# Patient Record
Sex: Female | Born: 1990 | Race: White | Hispanic: No | Marital: Married | Smoking: Never smoker
Health system: Southern US, Community
[De-identification: ages and names within clinical notes are randomized; demographics above are authoritative.]

## PROBLEM LIST (undated history)

## (undated) DIAGNOSIS — K589 Irritable bowel syndrome without diarrhea: Secondary | ICD-10-CM

## (undated) HISTORY — PX: IUD REMOVAL: SHX5392

## (undated) HISTORY — DX: Irritable bowel syndrome, unspecified: K58.9

---

## 2012-09-14 ENCOUNTER — Ambulatory Visit (INDEPENDENT_AMBULATORY_CARE_PROVIDER_SITE_OTHER): Payer: BC Managed Care – PPO

## 2012-09-14 ENCOUNTER — Other Ambulatory Visit: Payer: Self-pay | Admitting: Family Medicine

## 2012-09-14 DIAGNOSIS — Z561 Change of job: Secondary | ICD-10-CM

## 2012-09-14 DIAGNOSIS — Z0289 Encounter for other administrative examinations: Secondary | ICD-10-CM

## 2012-09-24 ENCOUNTER — Encounter: Payer: Self-pay | Admitting: Certified Nurse Midwife

## 2012-09-27 ENCOUNTER — Ambulatory Visit: Payer: Self-pay | Admitting: Certified Nurse Midwife

## 2012-11-06 ENCOUNTER — Telehealth: Payer: Self-pay | Admitting: Certified Nurse Midwife

## 2012-11-06 NOTE — Telephone Encounter (Signed)
Spoke with pt who would like IUD removed because she and her husband are going to [place] when it is scheduled to be removed at the 5 year mark. Pt would rather have it removed before she goes. Scheduled appt 11-13-12 at 2 pm with DL. Advised someone from insurance dept would be calling with OOP cost. Pt agreeable.

## 2012-11-06 NOTE — Telephone Encounter (Signed)
Patient wants to schedule an appointment for iud removal and does not wants another one inserted. Please advise? AEX 11/19/12.

## 2012-11-12 ENCOUNTER — Other Ambulatory Visit: Payer: Self-pay | Admitting: Certified Nurse Midwife

## 2012-11-12 DIAGNOSIS — Z309 Encounter for contraceptive management, unspecified: Secondary | ICD-10-CM

## 2012-11-12 NOTE — Progress Notes (Signed)
Pt is having IUD removal on 11/12/12.  Order entered to be linked to appt.

## 2012-11-13 ENCOUNTER — Encounter: Payer: Self-pay | Admitting: Certified Nurse Midwife

## 2012-11-13 ENCOUNTER — Ambulatory Visit (INDEPENDENT_AMBULATORY_CARE_PROVIDER_SITE_OTHER): Payer: BC Managed Care – PPO | Admitting: Certified Nurse Midwife

## 2012-11-13 VITALS — BP 100/64 | HR 64 | Resp 16 | Ht 62.75 in | Wt 164.0 lb

## 2012-11-13 DIAGNOSIS — Z30432 Encounter for removal of intrauterine contraceptive device: Secondary | ICD-10-CM

## 2012-11-13 NOTE — Progress Notes (Signed)
[name]      Presents for [facility] IUD removal.  Denies any vaginal symptoms or STD concerns.  Plans for contraception arecondoms or natural family planning. 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  Will come in for aex before leaving. No health issues today.  LMPJuly 25.2014        HPI neg.  Exam: time, place and person Abdomen: soft non-tender Groin:no inguinal nodes palpated    Pelvic exam:Pelvic exam: normal external genitalia, vulva, vagina, cervix, uterus and adnexa, VULVA: normal appearing vulva with no masses, tenderness or lesions, VAGINA: normal appearing vagina with normal color and discharge, no lesions, CERVIX: normal appearing cervix without discharge or lesions, IUD noted in cervix, UTERUS: uterus is normal size, shape, consistency and nontender, anteverted, ADNEXA: normal adnexa in size, nontender and no masses.  Procedure: Speculum placed, cervix visualized.  IUD string visualized, grasp with ring forceps, with gentle traction IUD removed intact.  IUD shown to patient and discarded. Speculum removed.   Assessment:Skyla IUD removal Pt tolerated procedure well.  Plan: Begin contraceptive choice ofcondoms and natural family planning   Questions addressed  Return Visit: Aex

## 2012-11-15 NOTE — Progress Notes (Signed)
Note reviewed, agree with plan.  Mekhi Sonn, MD  

## 2012-11-19 ENCOUNTER — Ambulatory Visit (INDEPENDENT_AMBULATORY_CARE_PROVIDER_SITE_OTHER): Payer: BC Managed Care – PPO | Admitting: Certified Nurse Midwife

## 2012-11-19 ENCOUNTER — Encounter: Payer: Self-pay | Admitting: Certified Nurse Midwife

## 2012-11-19 VITALS — BP 100/60 | HR 60 | Resp 16 | Ht 62.75 in | Wt 162.0 lb

## 2012-11-19 DIAGNOSIS — Z01419 Encounter for gynecological examination (general) (routine) without abnormal findings: Secondary | ICD-10-CM

## 2012-11-19 DIAGNOSIS — Z0000 Encounter for general adult medical examination without abnormal findings: Secondary | ICD-10-CM

## 2012-11-19 NOTE — Patient Instructions (Addendum)
General topics  Next pap or exam is  due in 1 year Take a Women's multivitamin Take 1200 mg. of calcium daily - prefer dietary If any concerns in interim to call back  Breast Self-Awareness Practicing breast self-awareness may pick up problems early, prevent significant medical complications, and possibly save your life. By practicing breast self-awareness, you can become familiar with how your breasts look and feel and if your breasts are changing. This allows you to notice changes early. It can also offer you some reassurance that your breast health is good. One way to learn what is normal for your breasts and whether your breasts are changing is to do a breast self-exam. If you find a lump or something that was not present in the past, it is best to contact your caregiver right away. Other findings that should be evaluated by your caregiver include nipple discharge, especially if it is bloody; skin changes or reddening; areas where the skin seems to be pulled in (retracted); or new lumps and bumps. Breast pain is seldom associated with cancer (malignancy), but should also be evaluated by a caregiver. BREAST SELF-EXAM The best time to examine your breasts is 5 7 days after your menstrual period is over.  ExitCare Patient Information 2013 [employer].   Exercise to Stay Healthy Exercise helps you become and stay healthy. EXERCISE IDEAS AND TIPS Choose exercises that:  You enjoy.  Fit into your day. You do not need to exercise really hard to be healthy. You can do exercises at a slow or medium level and stay healthy. You can:  Stretch before and after working out.  Try yoga, Pilates, or tai chi.  Lift weights.  Walk fast, swim, jog, run, climb stairs, bicycle, dance, or rollerskate.  Take aerobic classes. Exercises that burn about 150 calories:  Running 1  miles in 15 minutes.  Playing volleyball for 45 to 60 minutes.  Washing and waxing a car for 45 to 60  minutes.  Playing touch football for 45 minutes.  Walking 1  miles in 35 minutes.  Pushing a stroller 1  miles in 30 minutes.  Playing basketball for 30 minutes.  Raking leaves for 30 minutes.  Bicycling 5 miles in 30 minutes.  Walking 2 miles in 30 minutes.  Dancing for 30 minutes.  Shoveling snow for 15 minutes.  Swimming laps for 20 minutes.  Walking up stairs for 15 minutes.  Bicycling 4 miles in 15 minutes.  Gardening for 30 to 45 minutes.  Jumping rope for 15 minutes.  Washing windows or floors for 45 to 60 minutes. Document Released: 05/07/2010 Document Revised: 06/27/2011 Document Reviewed: 05/07/2010 ExitCare Patient Information 2013 [employer].   Other topics ( that may be useful information):    Sexually Transmitted Disease Sexually transmitted disease (STD) refers to any infection that is passed from person to person during sexual activity. This may happen by way of saliva, semen, blood, vaginal mucus, or urine. Common STDs include:  Gonorrhea.  Chlamydia.  Syphilis.  HIV/AIDS.  Genital herpes.  Hepatitis B and C.  Trichomonas.  Human papillomavirus (HPV).  Pubic lice. CAUSES  An STD may be spread by bacteria, virus, or parasite. A person can get an STD by:  Sexual intercourse with an infected person.  Sharing sex toys with an infected person.  Sharing needles with an infected person.  Having intimate contact with the genitals, mouth, or rectal areas of an infected person. SYMPTOMS  Some people may not have any symptoms, but   they can still pass the infection to others. Different STDs have different symptoms. Symptoms include:  Painful or bloody urination.  Pain in the pelvis, abdomen, vagina, anus, throat, or eyes.  Skin rash, itching, irritation, growths, or sores (lesions). These usually occur in the genital or anal area.  Abnormal vaginal discharge.  Penile discharge in men.  Soft, flesh-colored skin growths in the  genital or anal area.  Fever.  Pain or bleeding during sexual intercourse.  Swollen glands in the groin area.  Yellow skin and eyes (jaundice). This is seen with hepatitis. DIAGNOSIS  To make a diagnosis, your caregiver may:  Take a medical history.  Perform a physical exam.  Take a specimen (culture) to be examined.  Examine a sample of discharge under a microscope.  Perform blood test TREATMENT   Chlamydia, gonorrhea, trichomonas, and syphilis can be cured with antibiotic medicine.  Genital herpes, hepatitis, and HIV can be treated, but not cured, with prescribed medicines. The medicines will lessen the symptoms.  Genital warts from HPV can be treated with medicine or by freezing, burning (electrocautery), or surgery. Warts may come back.  HPV is a virus and cannot be cured with medicine or surgery.However, abnormal areas may be followed very closely by your caregiver and may be removed from the cervix, vagina, or vulva through office procedures or surgery. If your diagnosis is confirmed, your recent sexual partners need treatment. This is true even if they are symptom-free or have a negative culture or evaluation. They should not have sex until their caregiver says it is okay. HOME CARE INSTRUCTIONS  All sexual partners should be informed, tested, and treated for all STDs.  Take your antibiotics as directed. Finish them even if you start to feel better.  Only take over-the-counter or prescription medicines for pain, discomfort, or fever as directed by your caregiver.  Rest.  Eat a balanced diet and drink enough fluids to keep your urine clear or pale yellow.  Do not have sex until treatment is completed and you have followed up with your caregiver. STDs should be checked after treatment.  Keep all follow-up appointments, Pap tests, and blood tests as directed by your caregiver.  Only use latex condoms and water-soluble lubricants during sexual activity. Do not use  petroleum jelly or oils.  Avoid alcohol and illegal drugs.  Get vaccinated for HPV and hepatitis. If you have not received these vaccines in the past, talk to your caregiver about whether one or both might be right for you.  Avoid risky sex practices that can break the skin. The only way to avoid getting an STD is to avoid all sexual activity.Latex condoms and dental dams (for oral sex) will help lessen the risk of getting an STD, but will not completely eliminate the risk. SEEK MEDICAL CARE IF:   You have a fever.  You have any new or worsening symptoms. Document Released: 06/25/2002 Document Revised: 06/27/2011 Document Reviewed: 07/02/2010 ExitCare Patient Information 2013 [employer].    Domestic Abuse You are being battered or abused if someone close to you hits, pushes, or physically hurts you in any way. You also are being abused if you are forced into activities. You are being sexually abused if you are forced to have sexual contact of any kind. You are being emotionally abused if you are made to feel worthless or if you are constantly threatened. It is important to remember that help is available. No one has the right to abuse you. PREVENTION OF FURTHER   ABUSE  Learn the warning signs of danger. This varies with situations but may include: the use of alcohol, threats, isolation from friends and family, or forced sexual contact. Leave if you feel that violence is going to occur.  If you are attacked or beaten, report it to the police so the abuse is documented. You do not have to press charges. The police can protect you while you or the attackers are leaving. Get the officer's name and badge number and a copy of the report.  Find someone you can trust and tell them what is happening to you: your caregiver, a nurse, clergy member, close friend or family member. Feeling ashamed is natural, but remember that you have done nothing wrong. No one deserves abuse. Document Released:  04/01/2000 Document Revised: 06/27/2011 Document Reviewed: 06/10/2010 ExitCare Patient Information 2013 [employer].    How Much is Too Much Alcohol? Drinking too much alcohol can cause injury, accidents, and health problems. These types of problems can include:   Car crashes.  Falls.  Family fighting (domestic violence).  Drowning.  Fights.  Injuries.  Burns.  Damage to certain organs.  Having a baby with birth defects. ONE DRINK CAN BE TOO MUCH WHEN YOU ARE:  Working.  Pregnant or breastfeeding.  Taking medicines. Ask your doctor.  Driving or planning to drive. If you or someone you know has a drinking problem, get help from a doctor.  Document Released: 01/29/2009 Document Revised: 06/27/2011 Document Reviewed: 01/29/2009 ExitCare Patient Information 2013 [employer].   Smoking Hazards Smoking cigarettes is extremely bad for your health. Tobacco smoke has over 200 known poisons in it. There are over 60 chemicals in tobacco smoke that cause cancer. Some of the chemicals found in cigarette smoke include:   Cyanide.  Benzene.  Formaldehyde.  Methanol (wood alcohol).  Acetylene (fuel used in welding torches).  Ammonia. Cigarette smoke also contains the poisonous gases nitrogen oxide and carbon monoxide.  Cigarette smokers have an increased risk of many serious medical problems and Smoking causes approximately:  90% of all lung cancer deaths in men.  80% of all lung cancer deaths in women.  90% of deaths from chronic obstructive lung disease. Compared with nonsmokers, smoking increases the risk of:  Coronary heart disease by 2 to 4 times.  Stroke by 2 to 4 times.  Men developing lung cancer by 23 times.  Women developing lung cancer by 13 times.  Dying from chronic obstructive lung diseases by 12 times.  . Smoking is the most preventable cause of death and disease in our society.  WHY IS SMOKING ADDICTIVE?  Nicotine is the chemical  agent in tobacco that is capable of causing addiction or dependence.  When you smoke and inhale, nicotine is absorbed rapidly into the bloodstream through your lungs. Nicotine absorbed through the lungs is capable of creating a powerful addiction. Both inhaled and non-inhaled nicotine may be addictive.  Addiction studies of cigarettes and spit tobacco show that addiction to nicotine occurs mainly during the teen years, when young people begin using tobacco products. WHAT ARE THE BENEFITS OF QUITTING?  There are many health benefits to quitting smoking.   Likelihood of developing cancer and heart disease decreases. Health improvements are seen almost immediately.  Blood pressure, pulse rate, and breathing patterns start returning to normal soon after quitting. QUITTING SMOKING   American Lung Association - 1-800-LUNGUSA  American Cancer Society - 1-800-ACS-2345 Document Released: 05/12/2004 Document Revised: 06/27/2011 Document Reviewed: 01/14/2009 ExitCare Patient Information 2013 ExitCare[employer]   LL[employer].   Stress Management Stress is a state of physical or mental tension that often results from changes in your life or normal routine. Some common causes of stress are:  Death of a loved one.  Injuries or severe illnesses.  Getting fired or changing jobs.  Moving into a new home. Other causes may be:  Sexual problems.  Business or financial losses.  Taking on a large debt.  Regular conflict with someone at home or at work.  Constant tiredness from lack of sleep. It is not just bad things that are stressful. It may be stressful to:  Win the lottery.  Get married.  Buy a new car. The amount of stress that can be easily tolerated varies from person to person. Changes generally cause stress, regardless of the types of change. Too much stress can affect your health. It may lead to physical or emotional problems. Too little stress (boredom) may also become stressful. SUGGESTIONS TO  REDUCE STRESS:  Talk things over with your family and friends. It often is helpful to share your concerns and worries. If you feel your problem is serious, you may want to get help from a professional counselor.  Consider your problems one at a time instead of lumping them all together. Trying to take care of everything at once may seem impossible. List all the things you need to do and then start with the most important one. Set a goal to accomplish 2 or 3 things each day. If you expect to do too many in a single day you will naturally fail, causing you to feel even more stressed.  Do not use alcohol or drugs to relieve stress. Although you may feel better for a short time, they do not remove the problems that caused the stress. They can also be habit forming.  Exercise regularly - at least 3 times per week. Physical exercise can help to relieve that "uptight" feeling and will relax you.  The shortest distance between despair and hope is often a good night's sleep.  Go to bed and get up on time allowing yourself time for appointments without being rushed.  Take a short "time-out" period from any stressful situation that occurs during the day. Close your eyes and take some deep breaths. Starting with the muscles in your face, tense them, hold it for a few seconds, then relax. Repeat this with the muscles in your neck, shoulders, hand, stomach, back and legs.  Take good care of yourself. Eat a balanced diet and get plenty of rest.  Schedule time for having fun. Take a break from your daily routine to relax. HOME CARE INSTRUCTIONS   Call if you feel overwhelmed by your problems and feel you can no longer manage them on your own.  Return immediately if you feel like hurting yourself or someone else. Document Released: 09/28/2000 Document Revised: 06/27/2011 Document Reviewed: 05/21/2007 ExitCare Patient Information 2013 [employer].   

## 2012-11-19 NOTE — Progress Notes (Signed)
22 y.o. G0P0000 Married Caucasian Fe here for annual exam.  Period normal after IUD removal a week ago. Denies any problems today. 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  Contraception is natural family planning with condom use.  Patient's last menstrual period was 11/09/2012.          Sexually active: yes  The current method of family planning is rhythm method.    Exercising: yes  aerobics, pilates Smoker:  no  Health Maintenance: Pap:  08-29-11 neg MMG:  none Colonoscopy:  none BMD:   none TDaP:  2008 Labs: none Self breast exam done: done monthly   reports that she has never smoked. She does not have any smokeless tobacco history on file. She reports that she does not drink alcohol or use illicit drugs.  Past Medical History  Diagnosis Date  . IBS (irritable bowel syndrome)     ?    Past Surgical History  Procedure Laterality Date  . Iud removal      skyla removed 7/14    Current Outpatient Prescriptions  Medication Sig Dispense Refill  . Multiple Vitamins-Minerals (MULTIVITAMIN PO) Take by mouth daily.       No current facility-administered medications for this visit.    Family History  Problem Relation Age of Onset  . Multiple sclerosis Mother   . Thyroid disease Mother   . Osteoporosis Mother   . Depression Father   . Anxiety disorder Father   . Cancer Paternal Grandfather     colon  . Heart attack Maternal Grandmother   . Heart attack Maternal Grandfather     ROS:  Pertinent items are noted in HPI.  Otherwise, a comprehensive ROS was negative.  Exam:   BP 100/60  Pulse 60  Resp 16  Ht 5' 2.75" (1.594 m)  Wt 162 lb (73.483 kg)  BMI 28.92 kg/m2  LMP 11/09/2012 Height: 5' 2.75" (159.4 cm)  Ht Readings from Last 3 Encounters:  11/19/12 5' 2.75" (1.594 m)  11/13/12 5' 2.75" (1.594 m)    General appearance: alert, cooperative and appears stated age Head: Normocephalic, without obvious abnormality, atraumatic Neck: no  adenopathy, supple, symmetrical, trachea midline and thyroid normal to inspection and palpation Lungs: clear to auscultation bilaterally Breasts: normal appearance, no masses or tenderness, No nipple retraction or dimpling, No nipple discharge or bleeding, No axillary or supraclavicular adenopathy Heart: regular rate and rhythm Abdomen: soft, non-tender; no masses,  no organomegaly Extremities: extremities normal, atraumatic, no cyanosis or edema Skin: Skin color, texture, turgor normal. No rashes or lesions Lymph nodes: Cervical, supraclavicular, and axillary nodes normal. No abnormal inguinal nodes palpated Neurologic: Grossly normal   Pelvic: External genitalia:  no lesions              Urethra:  normal appearing urethra with no masses, tenderness or lesions              Bartholin's and Skene's: normal                 Vagina: normal appearing vagina with normal color and discharge, no lesions              Cervix: normal, non tender              Pap taken: yes Bimanual Exam:  Uterus:  normal size, contour, position, consistency, mobility, non-tender and anteverted              Adnexa: normal adnexa and no mass, fullness, tenderness  Rectovaginal: Confirms               Anus:  normal sphincter tone, no lesions  A:  Well Woman with normal exam  Contraception, NFP with condom use  Moving to [place] for 2 years  P:   Reviewed health and wellness pertinent to exam  Has ovulation chart and BBT graph to help monitor safe days.  Pap smear as per guidelines   pap smear taken today  counseled on breast self exam, feminine hygiene, adequate intake of calcium and vitamin D, diet and exercise  return annually or prn  An After Visit Summary was printed and given to the patient.

## 2012-11-19 NOTE — Progress Notes (Signed)
Note reviewed, agree with plan.  Melyna Huron, MD  

## 2013-12-05 ENCOUNTER — Encounter: Payer: Self-pay | Admitting: Certified Nurse Midwife

## 2013-12-05 ENCOUNTER — Ambulatory Visit (INDEPENDENT_AMBULATORY_CARE_PROVIDER_SITE_OTHER): Payer: BC Managed Care – PPO | Admitting: Certified Nurse Midwife

## 2013-12-05 VITALS — BP 90/60 | HR 64 | Resp 16 | Ht 62.75 in | Wt 172.0 lb

## 2013-12-05 DIAGNOSIS — Z01419 Encounter for gynecological examination (general) (routine) without abnormal findings: Secondary | ICD-10-CM

## 2013-12-05 DIAGNOSIS — Z0000 Encounter for general adult medical examination without abnormal findings: Secondary | ICD-10-CM

## 2013-12-05 LAB — POCT URINALYSIS DIPSTICK
Bilirubin, UA: NEGATIVE
Blood, UA: NEGATIVE
Glucose, UA: NEGATIVE
KETONES UA: NEGATIVE
LEUKOCYTES UA: NEGATIVE
Nitrite, UA: NEGATIVE
PH UA: 5
PROTEIN UA: NEGATIVE
UROBILINOGEN UA: NEGATIVE

## 2013-12-05 LAB — HEMOGLOBIN, FINGERSTICK: Hemoglobin, fingerstick: 15.3 g/dL (ref 12.0–16.0)

## 2013-12-05 NOTE — Patient Instructions (Signed)
General topics  Next pap or exam is  due in 1 year Take a Women's multivitamin Take 1200 mg. of calcium daily - prefer dietary If any concerns in interim to call back  Breast Self-Awareness Practicing breast self-awareness may pick up problems early, prevent significant medical complications, and possibly save your life. By practicing breast self-awareness, you can become familiar with how your breasts look and feel and if your breasts are changing. This allows you to notice changes early. It can also offer you some reassurance that your breast health is good. One way to learn what is normal for your breasts and whether your breasts are changing is to do a breast self-exam. If you find a lump or something that was not present in the past, it is best to contact your caregiver right away. Other findings that should be evaluated by your caregiver include nipple discharge, especially if it is bloody; skin changes or reddening; areas where the skin seems to be pulled in (retracted); or new lumps and bumps. Breast pain is seldom associated with cancer (malignancy), but should also be evaluated by a caregiver. BREAST SELF-EXAM The best time to examine your breasts is 5 7 days after your menstrual period is over.  ExitCare Patient Information 2013 [employer].   Exercise to Stay Healthy Exercise helps you become and stay healthy. EXERCISE IDEAS AND TIPS Choose exercises that:  You enjoy.  Fit into your day. You do not need to exercise really hard to be healthy. You can do exercises at a slow or medium level and stay healthy. You can:  Stretch before and after working out.  Try yoga, Pilates, or tai chi.  Lift weights.  Walk fast, swim, jog, run, climb stairs, bicycle, dance, or rollerskate.  Take aerobic classes. Exercises that burn about 150 calories:  Running 1  miles in 15 minutes.  Playing volleyball for 45 to 60 minutes.  Washing and waxing a car for 45 to 60  minutes.  Playing touch football for 45 minutes.  Walking 1  miles in 35 minutes.  Pushing a stroller 1  miles in 30 minutes.  Playing basketball for 30 minutes.  Raking leaves for 30 minutes.  Bicycling 5 miles in 30 minutes.  Walking 2 miles in 30 minutes.  Dancing for 30 minutes.  Shoveling snow for 15 minutes.  Swimming laps for 20 minutes.  Walking up stairs for 15 minutes.  Bicycling 4 miles in 15 minutes.  Gardening for 30 to 45 minutes.  Jumping rope for 15 minutes.  Washing windows or floors for 45 to 60 minutes. Document Released: 05/07/2010 Document Revised: 06/27/2011 Document Reviewed: 05/07/2010 ExitCare Patient Information 2013 Exit[employer].   Other topics ( that may be useful information):    Sexually Transmitted Disease Sexually transmitted disease (STD) refers to any infection that is passed from person to person during sexual activity. This may happen by way of saliva, semen, blood, vaginal mucus, or urine. Common STDs include:  Gonorrhea.  Chlamydia.  Syphilis.  HIV/AIDS.  Genital herpes.  Hepatitis B and C.  Trichomonas.  Human papillomavirus (HPV).  Pubic lice. CAUSES  An STD may be spread by bacteria, virus, or parasite. A person can get an STD by:  Sexual intercourse with an infected person.  Sharing sex toys with an infected person.  Sharing needles with an infected person.  Having intimate contact with the genitals, mouth, or rectal areas of an infected person. SYMPTOMS  Some people may not have any symptoms, but   they can still pass the infection to others. Different STDs have different symptoms. Symptoms include:  Painful or bloody urination.  Pain in the pelvis, abdomen, vagina, anus, throat, or eyes.  Skin rash, itching, irritation, growths, or sores (lesions). These usually occur in the genital or anal area.  Abnormal vaginal discharge.  Penile discharge in men.  Soft, flesh-colored skin growths in the  genital or anal area.  Fever.  Pain or bleeding during sexual intercourse.  Swollen glands in the groin area.  Yellow skin and eyes (jaundice). This is seen with hepatitis. DIAGNOSIS  To make a diagnosis, your caregiver may:  Take a medical history.  Perform a physical exam.  Take a specimen (culture) to be examined.  Examine a sample of discharge under a microscope.  Perform blood test TREATMENT   Chlamydia, gonorrhea, trichomonas, and syphilis can be cured with antibiotic medicine.  Genital herpes, hepatitis, and HIV can be treated, but not cured, with prescribed medicines. The medicines will lessen the symptoms.  Genital warts from HPV can be treated with medicine or by freezing, burning (electrocautery), or surgery. Warts may come back.  HPV is a virus and cannot be cured with medicine or surgery.However, abnormal areas may be followed very closely by your caregiver and may be removed from the cervix, vagina, or vulva through office procedures or surgery. If your diagnosis is confirmed, your recent sexual partners need treatment. This is true even if they are symptom-free or have a negative culture or evaluation. They should not have sex until their caregiver says it is okay. HOME CARE INSTRUCTIONS  All sexual partners should be informed, tested, and treated for all STDs.  Take your antibiotics as directed. Finish them even if you start to feel better.  Only take over-the-counter or prescription medicines for pain, discomfort, or fever as directed by your caregiver.  Rest.  Eat a balanced diet and drink enough fluids to keep your urine clear or pale yellow.  Do not have sex until treatment is completed and you have followed up with your caregiver. STDs should be checked after treatment.  Keep all follow-up appointments, Pap tests, and blood tests as directed by your caregiver.  Only use latex condoms and water-soluble lubricants during sexual activity. Do not use  petroleum jelly or oils.  Avoid alcohol and illegal drugs.  Get vaccinated for HPV and hepatitis. If you have not received these vaccines in the past, talk to your caregiver about whether one or both might be right for you.  Avoid risky sex practices that can break the skin. The only way to avoid getting an STD is to avoid all sexual activity.Latex condoms and dental dams (for oral sex) will help lessen the risk of getting an STD, but will not completely eliminate the risk. SEEK MEDICAL CARE IF:   You have a fever.  You have any new or worsening symptoms. Document Released: 06/25/2002 Document Revised: 06/27/2011 Document Reviewed: 07/02/2010 [facility] Patient Information 2013 [employer].    Domestic Abuse You are being battered or abused if someone close to you hits, pushes, or physically hurts you in any way. You also are being abused if you are forced into activities. You are being sexually abused if you are forced to have sexual contact of any kind. You are being emotionally abused if you are made to feel worthless or if you are constantly threatened. It is important to remember that help is available. No one has the right to abuse you. PREVENTION OF FURTHER  ABUSE  Learn the warning signs of danger. This varies with situations but may include: the use of alcohol, threats, isolation from friends and family, or forced sexual contact. Leave if you feel that violence is going to occur.  If you are attacked or beaten, report it to the police so the abuse is documented. You do not have to press charges. The police can protect you while you or the attackers are leaving. Get the officer's name and badge number and a copy of the report.  Find someone you can trust and tell them what is happening to you: your caregiver, a nurse, clergy member, close friend or family member. Feeling ashamed is natural, but remember that you have done nothing wrong. No one deserves abuse. Document Released:  04/01/2000 Document Revised: 06/27/2011 Document Reviewed: 06/10/2010 ExitCare Patient Information 2013 [employer].    How Much is Too Much Alcohol? Drinking too much alcohol can cause injury, accidents, and health problems. These types of problems can include:   Car crashes.  Falls.  Family fighting (domestic violence).  Drowning.  Fights.  Injuries.  Burns.  Damage to certain organs.  Having a baby with birth defects. ONE DRINK CAN BE TOO MUCH WHEN YOU ARE:  Working.  Pregnant or breastfeeding.  Taking medicines. Ask your doctor.  Driving or planning to drive. If you or someone you know has a drinking problem, get help from a doctor.  Document Released: 01/29/2009 Document Revised: 06/27/2011 Document Reviewed: 01/29/2009 ExitCare Patient Information 2013 [employer].   Smoking Hazards Smoking cigarettes is extremely bad for your health. Tobacco smoke has over 200 known poisons in it. There are over 60 chemicals in tobacco smoke that cause cancer. Some of the chemicals found in cigarette smoke include:   Cyanide.  Benzene.  Formaldehyde.  Methanol (wood alcohol).  Acetylene (fuel used in welding torches).  Ammonia. Cigarette smoke also contains the poisonous gases nitrogen oxide and carbon monoxide.  Cigarette smokers have an increased risk of many serious medical problems and Smoking causes approximately:  90% of all lung cancer deaths in men.  80% of all lung cancer deaths in women.  90% of deaths from chronic obstructive lung disease. Compared with nonsmokers, smoking increases the risk of:  Coronary heart disease by 2 to 4 times.  Stroke by 2 to 4 times.  Men developing lung cancer by 23 times.  Women developing lung cancer by 13 times.  Dying from chronic obstructive lung diseases by 12 times.  . Smoking is the most preventable cause of death and disease in our society.  WHY IS SMOKING ADDICTIVE?  Nicotine is the chemical  agent in tobacco that is capable of causing addiction or dependence.  When you smoke and inhale, nicotine is absorbed rapidly into the bloodstream through your lungs. Nicotine absorbed through the lungs is capable of creating a powerful addiction. Both inhaled and non-inhaled nicotine may be addictive.  Addiction studies of cigarettes and spit tobacco show that addiction to nicotine occurs mainly during the teen years, when young people begin using tobacco products. WHAT ARE THE BENEFITS OF QUITTING?  There are many health benefits to quitting smoking.   Likelihood of developing cancer and heart disease decreases. Health improvements are seen almost immediately.  Blood pressure, pulse rate, and breathing patterns start returning to normal soon after quitting. QUITTING SMOKING   American Lung Association - 1-800-LUNGUSA  American Cancer Society - 1-800-ACS-2345 Document Released: 05/12/2004 Document Revised: 06/27/2011 Document Reviewed: 01/14/2009 ExitCare Patient Information 2013 ExitCare,   LLC.   Stress Management Stress is a state of physical or mental tension that often results from changes in your life or normal routine. Some common causes of stress are:  Death of a loved one.  Injuries or severe illnesses.  Getting fired or changing jobs.  Moving into a new home. Other causes may be:  Sexual problems.  Business or financial losses.  Taking on a large debt.  Regular conflict with someone at home or at work.  Constant tiredness from lack of sleep. It is not just bad things that are stressful. It may be stressful to:  Win the lottery.  Get married.  Buy a new car. The amount of stress that can be easily tolerated varies from person to person. Changes generally cause stress, regardless of the types of change. Too much stress can affect your health. It may lead to physical or emotional problems. Too little stress (boredom) may also become stressful. SUGGESTIONS TO  REDUCE STRESS:  Talk things over with your family and friends. It often is helpful to share your concerns and worries. If you feel your problem is serious, you may want to get help from a professional counselor.  Consider your problems one at a time instead of lumping them all together. Trying to take care of everything at once may seem impossible. List all the things you need to do and then start with the most important one. Set a goal to accomplish 2 or 3 things each day. If you expect to do too many in a single day you will naturally fail, causing you to feel even more stressed.  Do not use alcohol or drugs to relieve stress. Although you may feel better for a short time, they do not remove the problems that caused the stress. They can also be habit forming.  Exercise regularly - at least 3 times per week. Physical exercise can help to relieve that "uptight" feeling and will relax you.  The shortest distance between despair and hope is often a good night's sleep.  Go to bed and get up on time allowing yourself time for appointments without being rushed.  Take a short "time-out" period from any stressful situation that occurs during the day. Close your eyes and take some deep breaths. Starting with the muscles in your face, tense them, hold it for a few seconds, then relax. Repeat this with the muscles in your neck, shoulders, hand, stomach, back and legs.  Take good care of yourself. Eat a balanced diet and get plenty of rest.  Schedule time for having fun. Take a break from your daily routine to relax. HOME CARE INSTRUCTIONS   Call if you feel overwhelmed by your problems and feel you can no longer manage them on your own.  Return immediately if you feel like hurting yourself or someone else. Document Released: 09/28/2000 Document Revised: 06/27/2011 Document Reviewed: 05/21/2007 ExitCare Patient Information 2013 ExitCare, [employer].   

## 2013-12-05 NOTE — Progress Notes (Signed)
22 y.o. G0P0000 Married Caucasian Fe here for annual exam. Periods normal no issues. Contraception none. Not planning pregnancy but would be happy about. 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 "it has been a good year other hard to eat enough protein, in diet". Taking prenatal vitamins daily to help with diet needs and in case of pregnancy. No health issues.  Patient's last menstrual period was 11/26/2013.          Sexually active: Yes.    The current method of family planning is none.    Exercising: Yes.    walking & aerobics Smoker:  no  Health Maintenance: Pap:  11-19-12 neg MMG: none Colonoscopy:  none BMD:   none TDaP:  2008 Labs: Poct urine-neg, Hgb-15.3 Self breast exam: done occ   reports that she has never smoked. She does not have any smokeless tobacco history on file. She reports that she does not drink alcohol or use illicit drugs.  Past Medical History  Diagnosis Date  . IBS (irritable bowel syndrome)     ?    Past Surgical History  Procedure Laterality Date  . Iud removal      skyla removed 7/14    Current Outpatient Prescriptions  Medication Sig Dispense Refill  . Ascorbic Acid (VITAMIN C PO) Take by mouth as needed.      . Prenatal Vit-Fe Fumarate-FA (PRENATAL VITAMIN PO) Take by mouth daily.       No current facility-administered medications for this visit.    Family History  Problem Relation Age of Onset  . Multiple sclerosis Mother   . Thyroid disease Mother   . Osteoporosis Mother   . Other Mother     liver problems  . Depression Father   . Anxiety disorder Father   . Cancer Paternal Grandfather     colon  . Heart attack Maternal Grandmother   . Heart attack Maternal Grandfather     ROS:  Pertinent items are noted in HPI.  Otherwise, a comprehensive ROS was negative.  Exam:   Ht 5' 2.75" (1.594 m)  Wt 172 lb (78.019 kg)  BMI 30.71 kg/m2  LMP 11/26/2013 Height: 5' 2.75" (159.4 cm)  Ht Readings from Last 3 Encounters:  12/05/13 5' 2.75"  (1.594 m)  11/19/12 5' 2.75" (1.594 m)  11/13/12 5' 2.75" (1.594 m)    General appearance: alert, cooperative and appears stated age Head: Normocephalic, without obvious abnormality, atraumatic Neck: no adenopathy, supple, symmetrical, trachea midline and thyroid normal to inspection and palpation Lungs: clear to auscultation bilaterally Breasts: normal appearance, no masses or tenderness, No nipple retraction or dimpling, No nipple discharge or bleeding, No axillary or supraclavicular adenopathy Heart: regular rate and rhythm Abdomen: soft, non-tender; no masses,  no organomegaly Extremities: extremities normal, atraumatic, no cyanosis or edema Skin: Skin color, texture, turgor normal. No rashes or lesions Lymph nodes: Cervical, supraclavicular, and axillary nodes normal. No abnormal inguinal nodes palpated Neurologic: Grossly normal   Pelvic: External genitalia:  no lesions              Urethra:  normal appearing urethra with no masses, tenderness or lesions              Bartholin's and Skene's: normal                 Vagina: normal appearing vagina with normal color and discharge, no lesions              Cervix: normal, non tender, no lesions  Pap taken: No. Bimanual Exam:  Uterus:  normal size, contour, position, consistency, mobility, non-tender and anteverted              Adnexa: normal adnexa and no mass, fullness, tenderness               Rectovaginal: Confirms               Anus:  normal appearance  A:  Well Woman with normal exam  Contraception none    P:   Reviewed health and wellness pertinent to exam  Lab: TSH, Rubella  Pap smear not taken today  counseled on breast self exam, adequate intake of calcium and vitamin D, and protein sources such as beans, peanut butter, eggs,. diet and exercise  return annually or prn  An After Visit Summary was printed and given to the patient.

## 2013-12-05 NOTE — Progress Notes (Signed)
Reviewed personally.  M. Suzanne Alyx Mcguirk, MD.  

## 2013-12-06 LAB — RUBELLA SCREEN: Rubella: 2.32 Index — ABNORMAL HIGH (ref <0.90)

## 2013-12-06 LAB — TSH: TSH: 3.19 u[IU]/mL (ref 0.350–4.500)

## 2014-03-25 ENCOUNTER — Telehealth: Payer: Self-pay | Admitting: Certified Nurse Midwife

## 2014-03-25 NOTE — Telephone Encounter (Signed)
Left message to call Laterrance Nauta at [phone]. 

## 2014-03-25 NOTE — Telephone Encounter (Addendum)
Pt's mom Dennison BullaKenda called to get a referral to an obstetrician because pt is pregnant and currently out of the country but will be in the states in January. Mom is on her DPR to speak with.

## 2014-03-25 NOTE — Telephone Encounter (Signed)
Spoke with patient's mother Dennison BullaKenda. Okay per ROI. 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 Patient will return home on January 8th and would like to establish OBGYN care. Provided mother [facility] and [facility]. Mother is agreeable and will call to schedule patient an appointment. Will let our office know once patient is schedule so records can be sent to office she will be seen at. Mother is agreeable.  Routing to provider for final review. Patient agreeable to disposition. Will close encounter

## 2014-04-03 ENCOUNTER — Encounter: Payer: Self-pay | Admitting: *Deleted

## 2014-04-18 NOTE — L&D Delivery Note (Signed)
Delivery Note At 9:32 PM a viable female was delivered via Vaginal, Spontaneous Delivery (Presentation: ; Occiput Anterior).  APGAR: 8, 9; weight (pending)  Placenta status: Intact, Spontaneous.  Cord: 3 vessels with the following complications: None  Anesthesia: Epidural  Episiotomy: None Lacerations: 1st degree;Labial bilateral with superficial perineal Suture Repair: 3.0 CT, 4.0 vicryl Est. Blood Loss (mL):    Mom to postpartum.  Baby to Couplet care / Skin to Skin.  Upon arrival patient was complete and pushing. She pushed with good maternal effort to deliver a healthy baby boy. Baby delivered without difficulty, was noted to have good tone and place on maternal abdomen for oral suctioning, drying and stimulation. Delayed cord clamping performed and cut by FOB. Placenta delivered intact with 3V cord. Vaginal canal and perineum was inspected and with 1st degree laceration bilateral labial with superificial perineal lacerations were found and repaired in the usual fashion, hemostatic on completion, additionally small piece of redundant tissue clamped and cut, sutured to stop oozing. Pitocin was started and uterus massaged until bleeding slowed. Counts of sharps, instruments, and lap pads were all correct.    Karen PilarAlexander Kaci Dillie, DO [facility], PGY-2 09/06/2014, 10:19 PM

## 2014-04-30 ENCOUNTER — Ambulatory Visit (INDEPENDENT_AMBULATORY_CARE_PROVIDER_SITE_OTHER): Payer: BC Managed Care – PPO | Admitting: Family

## 2014-04-30 ENCOUNTER — Ambulatory Visit (HOSPITAL_COMMUNITY)
Admission: RE | Admit: 2014-04-30 | Discharge: 2014-04-30 | Disposition: A | Discharge status: Home / Self Care | Payer: BLUE CROSS/BLUE SHIELD | Source: Ambulatory Visit | Attending: Family | Admitting: Family

## 2014-04-30 ENCOUNTER — Encounter: Payer: Self-pay | Admitting: Family

## 2014-04-30 VITALS — BP 130/70 | HR 110 | Temp 98.0°F | Wt 173.1 lb

## 2014-04-30 DIAGNOSIS — O4692 Antepartum hemorrhage, unspecified, second trimester: Secondary | ICD-10-CM

## 2014-04-30 DIAGNOSIS — Z3A20 20 weeks gestation of pregnancy: Secondary | ICD-10-CM | POA: Insufficient documentation

## 2014-04-30 DIAGNOSIS — Z113 Encounter for screening for infections with a predominantly sexual mode of transmission: Secondary | ICD-10-CM | POA: Diagnosis not present

## 2014-04-30 DIAGNOSIS — Z3482 Encounter for supervision of other normal pregnancy, second trimester: Secondary | ICD-10-CM | POA: Diagnosis present

## 2014-04-30 DIAGNOSIS — Z118 Encounter for screening for other infectious and parasitic diseases: Secondary | ICD-10-CM | POA: Diagnosis not present

## 2014-04-30 DIAGNOSIS — Z124 Encounter for screening for malignant neoplasm of cervix: Secondary | ICD-10-CM

## 2014-04-30 DIAGNOSIS — O4690 Antepartum hemorrhage, unspecified, unspecified trimester: Secondary | ICD-10-CM | POA: Insufficient documentation

## 2014-04-30 DIAGNOSIS — Z3492 Encounter for supervision of normal pregnancy, unspecified, second trimester: Secondary | ICD-10-CM

## 2014-04-30 LAB — OB RESULTS CONSOLE GC/CHLAMYDIA
CHLAMYDIA, DNA PROBE: NEGATIVE
Gonorrhea: NEGATIVE

## 2014-04-30 LAB — POCT URINALYSIS DIP (DEVICE)
Bilirubin Urine: NEGATIVE
GLUCOSE, UA: NEGATIVE mg/dL
HGB URINE DIPSTICK: NEGATIVE
Ketones, ur: NEGATIVE mg/dL
Nitrite: NEGATIVE
Protein, ur: 30 mg/dL — AB
SPECIFIC GRAVITY, URINE: 1.02 (ref 1.005–1.030)
UROBILINOGEN UA: 1 mg/dL (ref 0.0–1.0)
pH: 7 (ref 5.0–8.0)

## 2014-04-30 LAB — OB RESULTS CONSOLE RPR: RPR: NONREACTIVE

## 2014-04-30 NOTE — Addendum Note (Signed)
Addended by: Sherre LainASH, Torrey Horseman A on: 04/30/2014 04:07 PM   Modules accepted: Orders

## 2014-04-30 NOTE — Patient Instructions (Signed)
Second Trimester of Pregnancy The second trimester is from week 13 through week 28, months 4 through 6. The second trimester is often a time when you feel your best. Your body has also adjusted to being pregnant, and you begin to feel better physically. Usually, morning sickness has lessened or quit completely, you may have more energy, and you may have an increase in appetite. The second trimester is also a time when the fetus is growing rapidly. At the end of the sixth month, the fetus is about 9 inches long and weighs about 1 pounds. You will likely begin to feel the baby move (quickening) between 18 and 20 weeks of the pregnancy. BODY CHANGES Your body goes through many changes during pregnancy. The changes vary from woman to woman.   Your weight will continue to increase. You will notice your lower abdomen bulging out.  You may begin to get stretch marks on your hips, abdomen, and breasts.  You may develop headaches that can be relieved by medicines approved by your health care provider.  You may urinate more often because the fetus is pressing on your bladder.  You may develop or continue to have heartburn as a result of your pregnancy.  You may develop constipation because certain hormones are causing the muscles that push waste through your intestines to slow down.  You may develop hemorrhoids or swollen, bulging veins (varicose veins).  You may have back pain because of the weight gain and pregnancy hormones relaxing your joints between the bones in your pelvis and as a result of a shift in weight and the muscles that support your balance.  Your breasts will continue to grow and be tender.  Your gums may bleed and may be sensitive to brushing and flossing.  Dark spots or blotches (chloasma, mask of pregnancy) may develop on your face. This will likely fade after the baby is born.  A dark line from your belly button to the pubic area (linea nigra) may appear. This will likely fade  after the baby is born.  You may have changes in your Logan. These can include thickening of your Ruby, rapid growth, and changes in texture. Some women also have Husain loss during or after pregnancy, or Key that feels dry or thin. Your Shaddock will most likely return to normal after your baby is born. WHAT TO EXPECT AT YOUR PRENATAL VISITS During a routine prenatal visit:  You will be weighed to make sure you and the fetus are growing normally.  Your blood pressure will be taken.  Your abdomen will be measured to track your baby's growth.  The fetal heartbeat will be listened to.  Any test results from the previous visit will be discussed. Your health care provider may ask you:  How you are feeling.  If you are feeling the baby move.  If you have had any abnormal symptoms, such as leaking fluid, bleeding, severe headaches, or abdominal cramping.  If you have any questions. Other tests that may be performed during your second trimester include:  Blood tests that check for:  Low iron levels (anemia).  Gestational diabetes (between 24 and 28 weeks).  Rh antibodies.  Urine tests to check for infections, diabetes, or protein in the urine.  An ultrasound to confirm the proper growth and development of the baby.  An amniocentesis to check for possible genetic problems.  Fetal screens for spina bifida and Down syndrome. HOME CARE INSTRUCTIONS   Avoid all smoking, herbs, alcohol, and unprescribed   drugs. These chemicals affect the formation and growth of the baby.  Follow your health care provider's instructions regarding medicine use. There are medicines that are either safe or unsafe to take during pregnancy.  Exercise only as directed by your health care provider. Experiencing uterine cramps is a good sign to stop exercising.  Continue to eat regular, healthy meals.  Wear a good support bra for breast tenderness.  Do not use hot tubs, steam rooms, or saunas.  Wear your  seat belt at all times when driving.  Avoid raw meat, uncooked cheese, cat litter boxes, and soil used by cats. These carry germs that can cause birth defects in the baby.  Take your prenatal vitamins.  Try taking a stool softener (if your health care provider approves) if you develop constipation. Eat more high-fiber foods, such as fresh vegetables or fruit and whole grains. Drink plenty of fluids to keep your urine clear or pale yellow.  Take warm sitz baths to soothe any pain or discomfort caused by hemorrhoids. Use hemorrhoid cream if your health care provider approves.  If you develop varicose veins, wear support hose. Elevate your feet for 15 minutes, 3-4 times a day. Limit salt in your diet.  Avoid heavy lifting, wear low heel shoes, and practice good posture.  Rest with your legs elevated if you have leg cramps or low back pain.  Visit your dentist if you have not gone yet during your pregnancy. Use a soft toothbrush to brush your teeth and be gentle when you floss.  A sexual relationship may be continued unless your health care provider directs you otherwise.  Continue to go to all your prenatal visits as directed by your health care provider. SEEK MEDICAL CARE IF:   You have dizziness.  You have mild pelvic cramps, pelvic pressure, or nagging pain in the abdominal area.  You have persistent nausea, vomiting, or diarrhea.  You have a bad smelling vaginal discharge.  You have pain with urination. SEEK IMMEDIATE MEDICAL CARE IF:   You have a fever.  You are leaking fluid from your vagina.  You have spotting or bleeding from your vagina.  You have severe abdominal cramping or pain.  You have rapid weight gain or loss.  You have shortness of breath with chest pain.  You notice sudden or extreme swelling of your face, hands, ankles, feet, or legs.  You have not felt your baby move in over an hour.  You have severe headaches that do not go away with  medicine.  You have vision changes. Document Released: 03/29/2001 Document Revised: 04/09/2013 Document Reviewed: 06/05/2012 ExitCare Patient Information 2015 [employer]. This information is not intended to replace advice given to you by your health care provider. Make sure you discuss any questions you have with your health care provider.  

## 2014-04-30 NOTE — Progress Notes (Signed)
Subjective:    Karen Bauer is a G1P0000 1828w1d being seen today for her first obstetrical visit.  Her obstetrical history is significant for late transfer of care from [place].  Recently moved back to [place].  Brown discharge early in pregnancy and was given IM progesterone.  No other problems.  . Patient does intend to breast feed. Pregnancy history fully reviewed.  Patient reports no complaints.  Filed Vitals:   04/30/14 0911  BP: 130/70  Pulse: 110  Temp: 98 F (36.7 C)  Weight: 173 lb 1.6 oz (78.518 kg)    HISTORY: OB History  Gravida Para Term Preterm AB SAB TAB Ectopic Multiple Living  1 0 0 0 0 0 0 0 0 0     # Outcome Date GA Lbr Len/2nd Weight Sex Delivery Anes PTL Lv  1 Current              Past Medical History  Diagnosis Date  . IBS (irritable bowel syndrome)     ?   Past Surgical History  Procedure Laterality Date  . Iud removal      skyla removed 7/14   Family History  Problem Relation Age of Onset  . Multiple sclerosis Mother   . Thyroid disease Mother   . Osteoporosis Mother   . Other Mother     liver problems  . Depression Father   . Anxiety disorder Father   . Cancer Paternal Grandfather     colon  . Heart attack Maternal Grandmother   . Heart attack Maternal Grandfather      Exam   BP 130/70 mmHg  Pulse 110  Temp(Src) 98 F (36.7 C)  Wt 173 lb 1.6 oz (78.518 kg)  LMP 11/26/2013 (Approximate) Uterine Size: size equals dates  Pelvic Exam:    Perineum: No Hemorrhoids, Normal Perineum   Vulva: normal   Vagina:  normal mucosa, normal discharge, no palpable nodules   pH: Not done   Cervix: Moderate amount of bleeding folloiwng Pap, no cervical motion tenderness.  Cervix appeared friable prior to pap.    Adnexa: normal adnexa and no mass, fullness, tenderness   Bony Pelvis: Adequate  System: Breast:  No nipple retraction or dimpling, No nipple discharge or bleeding, No axillary or supraclavicular adenopathy, Normal to palpation without  dominant masses   Skin: normal coloration and turgor, no rashes    Neurologic: negative   Extremities: normal strength, tone, and muscle mass   HEENT neck supple with midline trachea and thyroid without masses   Mouth/Teeth mucous membranes moist, pharynx normal without lesions   Neck supple and no masses   Cardiovascular: regular rate and rhythm, no murmurs or gallops   Respiratory:  appears well, vitals normal, no respiratory distress, acyanotic, normal RR, neck free of mass or lymphadenopathy, chest clear, no wheezing, crepitations, rhonchi, normal symmetric air entry   Abdomen: soft, non-tender; bowel sounds normal; no masses,  no organomegaly   Urinary: urethral meatus normal      Assessment:    Pregnancy:   24 yo G1P0000 at 5328w1d wks IUP Vaginal Bleeding in Second Trimester (following pap) Insufficient Prenatal Care  There are no active problems to display for this patient.       Plan:     Initial labs drawn. Prenatal vitamins. Problem list reviewed and updated. Genetic Screening discussed Quad Screen: declined.  Ultrasound discussed; fetal survey: ordered. Limited ultrasound ordered for today to assess placenta due to increased bleeding with pap.  Follow up in 4 weeks.  Marlis Edelson 04/30/2014

## 2014-04-30 NOTE — Addendum Note (Signed)
Addended by: Sherre LainASH, Gracelee Stemmler A on: 04/30/2014 04:10 PM   Modules accepted: Orders

## 2014-04-30 NOTE — Progress Notes (Signed)
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 Reports having little prenatal care in [place].  New OB labs today. Reports having flu vaccine in August prior to leaving for [place].   Pap done in 2014-- normal.  New OB packet given.

## 2014-05-01 LAB — OBSTETRIC PANEL
Antibody Screen: NEGATIVE
Basophils Absolute: 0 10*3/uL (ref 0.0–0.1)
Basophils Relative: 0 % (ref 0–1)
EOS ABS: 0.1 10*3/uL (ref 0.0–0.7)
Eosinophils Relative: 1 % (ref 0–5)
HCT: 38.5 % (ref 36.0–46.0)
HEMOGLOBIN: 13.5 g/dL (ref 12.0–15.0)
HEP B S AG: NEGATIVE
LYMPHS PCT: 20 % (ref 12–46)
Lymphs Abs: 1.9 10*3/uL (ref 0.7–4.0)
MCH: 30.4 pg (ref 26.0–34.0)
MCHC: 35.1 g/dL (ref 30.0–36.0)
MCV: 86.7 fL (ref 78.0–100.0)
MPV: 10.2 fL (ref 8.6–12.4)
Monocytes Absolute: 0.5 10*3/uL (ref 0.1–1.0)
Monocytes Relative: 5 % (ref 3–12)
NEUTROS PCT: 74 % (ref 43–77)
Neutro Abs: 7 10*3/uL (ref 1.7–7.7)
Platelets: 228 10*3/uL (ref 150–400)
RBC: 4.44 MIL/uL (ref 3.87–5.11)
RDW: 13.3 % (ref 11.5–15.5)
RH TYPE: POSITIVE
Rubella: 1.69 Index — ABNORMAL HIGH (ref <0.90)
WBC: 9.4 10*3/uL (ref 4.0–10.5)

## 2014-05-01 LAB — GC/CHLAMYDIA PROBE AMP
CT Probe RNA: NEGATIVE
GC PROBE AMP APTIMA: NEGATIVE

## 2014-05-02 ENCOUNTER — Ambulatory Visit (HOSPITAL_COMMUNITY)
Admission: RE | Admit: 2014-05-02 | Discharge: 2014-05-02 | Disposition: A | Discharge status: Home / Self Care | Payer: BLUE CROSS/BLUE SHIELD | Source: Ambulatory Visit | Attending: Family | Admitting: Family

## 2014-05-02 DIAGNOSIS — Z3482 Encounter for supervision of other normal pregnancy, second trimester: Secondary | ICD-10-CM | POA: Insufficient documentation

## 2014-05-02 DIAGNOSIS — Z3490 Encounter for supervision of normal pregnancy, unspecified, unspecified trimester: Secondary | ICD-10-CM | POA: Insufficient documentation

## 2014-05-02 DIAGNOSIS — Z3689 Encounter for other specified antenatal screening: Secondary | ICD-10-CM | POA: Insufficient documentation

## 2014-05-02 DIAGNOSIS — Z3A21 21 weeks gestation of pregnancy: Secondary | ICD-10-CM | POA: Insufficient documentation

## 2014-05-02 LAB — PRESCRIPTION MONITORING PROFILE (19 PANEL)
Amphetamine/Meth: NEGATIVE ng/mL
BARBITURATE SCREEN, URINE: NEGATIVE ng/mL
Benzodiazepine Screen, Urine: NEGATIVE ng/mL
Buprenorphine, Urine: NEGATIVE ng/mL
CANNABINOID SCRN UR: NEGATIVE ng/mL
CARISOPRODOL, URINE: NEGATIVE ng/mL
CREATININE, URINE: 140.95 mg/dL (ref >20.0)
Cocaine Metabolites: NEGATIVE ng/mL
ECSTASY: NEGATIVE ng/mL
Fentanyl, Ur: NEGATIVE ng/mL
METHAQUALONE SCREEN (URINE): NEGATIVE ng/mL
Meperidine, Ur: NEGATIVE ng/mL
Methadone Screen, Urine: NEGATIVE ng/mL
NITRITES URINE, INITIAL: NEGATIVE ug/mL
OPIATE SCREEN, URINE: NEGATIVE ng/mL
OXYCODONE SCRN UR: NEGATIVE ng/mL
PH URINE, INITIAL: 7.2 pH (ref 4.5–8.9)
Phencyclidine, Ur: NEGATIVE ng/mL
Propoxyphene: NEGATIVE ng/mL
Tapentadol, urine: NEGATIVE ng/mL
Tramadol Scrn, Ur: NEGATIVE ng/mL
ZOLPIDEM, URINE: NEGATIVE ng/mL

## 2014-05-02 LAB — CULTURE, OB URINE
Colony Count: NO GROWTH
ORGANISM ID, BACTERIA: NO GROWTH

## 2014-05-02 LAB — CYTOLOGY - PAP

## 2014-05-28 ENCOUNTER — Ambulatory Visit (INDEPENDENT_AMBULATORY_CARE_PROVIDER_SITE_OTHER): Payer: BC Managed Care – PPO | Admitting: Family

## 2014-05-28 VITALS — BP 129/72 | HR 108 | Temp 98.3°F | Wt 173.5 lb

## 2014-05-28 DIAGNOSIS — Z3492 Encounter for supervision of normal pregnancy, unspecified, second trimester: Secondary | ICD-10-CM

## 2014-05-28 DIAGNOSIS — Z3482 Encounter for supervision of other normal pregnancy, second trimester: Secondary | ICD-10-CM

## 2014-05-28 LAB — POCT URINALYSIS DIP (DEVICE)
BILIRUBIN URINE: NEGATIVE
Glucose, UA: NEGATIVE mg/dL
Hgb urine dipstick: NEGATIVE
Ketones, ur: NEGATIVE mg/dL
NITRITE: NEGATIVE
PROTEIN: NEGATIVE mg/dL
SPECIFIC GRAVITY, URINE: 1.02 (ref 1.005–1.030)
UROBILINOGEN UA: 0.2 mg/dL (ref 0.0–1.0)
pH: 7 (ref 5.0–8.0)

## 2014-05-28 NOTE — Patient Instructions (Signed)
Place 24-38 weeks prenatal visit patient instructions here.

## 2014-05-28 NOTE — Progress Notes (Signed)
Doing well; no questions or concerns.  Schedule follow-up ultrasound to assess growth (9 day discrepancy with initial date).  +round ligament pain.  Plan to obtain HIV with 1 hour test next week per patient patient preference.

## 2014-06-03 ENCOUNTER — Ambulatory Visit (HOSPITAL_COMMUNITY)
Admission: RE | Admit: 2014-06-03 | Discharge: 2014-06-03 | Disposition: A | Discharge status: Home / Self Care | Payer: BLUE CROSS/BLUE SHIELD | Source: Ambulatory Visit | Attending: Family | Admitting: Family

## 2014-06-03 DIAGNOSIS — O26849 Uterine size-date discrepancy, unspecified trimester: Secondary | ICD-10-CM | POA: Insufficient documentation

## 2014-06-03 DIAGNOSIS — O26842 Uterine size-date discrepancy, second trimester: Secondary | ICD-10-CM | POA: Diagnosis not present

## 2014-06-03 DIAGNOSIS — Z3A25 25 weeks gestation of pregnancy: Secondary | ICD-10-CM | POA: Insufficient documentation

## 2014-06-03 DIAGNOSIS — Z3492 Encounter for supervision of normal pregnancy, unspecified, second trimester: Secondary | ICD-10-CM

## 2014-06-03 DIAGNOSIS — Z36 Encounter for antenatal screening of mother: Secondary | ICD-10-CM | POA: Diagnosis not present

## 2014-06-03 DIAGNOSIS — Z0489 Encounter for examination and observation for other specified reasons: Secondary | ICD-10-CM | POA: Insufficient documentation

## 2014-06-03 DIAGNOSIS — IMO0002 Reserved for concepts with insufficient information to code with codable children: Secondary | ICD-10-CM | POA: Insufficient documentation

## 2014-06-06 ENCOUNTER — Telehealth: Payer: Self-pay | Admitting: *Deleted

## 2014-06-06 NOTE — Telephone Encounter (Signed)
Karen Bauer called and left a message that she had an ultrasound about a month ago and was told her baby was bigger than they thought it would be- and thought the EDD might need to be changed. States she had another ultrasound Tuesday and wanted to know if her EDD was changed.   Per review US done 06/03/14 and per radiology noted appropriate growth and EDD not changed.  Called Karen Bauer and explained that per radiology baby had appropriate growth and they did not recommend changing EDD, but our provider has ultimate decision re: EDD. I explained Karen Bauer not here today , but if EDD was changed she would be notified either at her next prenatal visit or a call prior. I [facility]. She voiced understanding.

## 2014-06-07 NOTE — Telephone Encounter (Signed)
Keep original EDD 09/12/14.

## 2014-06-09 ENCOUNTER — Telehealth: Payer: Self-pay | Admitting: *Deleted

## 2014-06-09 NOTE — Telephone Encounter (Signed)
Called Karen Bauer to let her know her EDD had not been changed after last ultrasound.( per her previous call).  No questions voiced.

## 2014-06-25 ENCOUNTER — Encounter: Payer: Self-pay | Admitting: Physician Assistant

## 2014-06-25 ENCOUNTER — Ambulatory Visit (INDEPENDENT_AMBULATORY_CARE_PROVIDER_SITE_OTHER): Payer: BLUE CROSS/BLUE SHIELD | Admitting: Physician Assistant

## 2014-06-25 VITALS — BP 123/79 | HR 105 | Wt 180.3 lb

## 2014-06-25 DIAGNOSIS — Z23 Encounter for immunization: Secondary | ICD-10-CM

## 2014-06-25 DIAGNOSIS — Z3492 Encounter for supervision of normal pregnancy, unspecified, second trimester: Secondary | ICD-10-CM

## 2014-06-25 DIAGNOSIS — Z3482 Encounter for supervision of other normal pregnancy, second trimester: Secondary | ICD-10-CM

## 2014-06-25 LAB — POCT URINALYSIS DIP (DEVICE)
Bilirubin Urine: NEGATIVE
Glucose, UA: NEGATIVE mg/dL
Ketones, ur: NEGATIVE mg/dL
Nitrite: NEGATIVE
PH: 7 (ref 5.0–8.0)
Protein, ur: 30 mg/dL — AB
Specific Gravity, Urine: 1.015 (ref 1.005–1.030)
UROBILINOGEN UA: 0.2 mg/dL (ref 0.0–1.0)

## 2014-06-25 LAB — CBC
HEMATOCRIT: 37.7 % (ref 36.0–46.0)
HEMOGLOBIN: 12.8 g/dL (ref 12.0–15.0)
MCH: 29.7 pg (ref 26.0–34.0)
MCHC: 34 g/dL (ref 30.0–36.0)
MCV: 87.5 fL (ref 78.0–100.0)
MPV: 9.9 fL (ref 8.6–12.4)
PLATELETS: 215 10*3/uL (ref 150–400)
RBC: 4.31 MIL/uL (ref 3.87–5.11)
RDW: 13.6 % (ref 11.5–15.5)
WBC: 9.7 10*3/uL (ref 4.0–10.5)

## 2014-06-25 MED ORDER — TETANUS-DIPHTH-ACELL PERTUSSIS 5-2.5-18.5 LF-MCG/0.5 IM SUSP
0.5000 mL | Freq: Once | INTRAMUSCULAR | Status: AC
  Administered 2014-06-25: 0.5 mL via INTRAMUSCULAR

## 2014-06-25 NOTE — Progress Notes (Signed)
28 week labs today. Pt undecided on tdap.

## 2014-06-25 NOTE — Progress Notes (Signed)
28 weeks.  Endorses good fetal movement.  Denies LOF, dysuria, vaginal bleeding. 28 week labs today and Tdap RTC 2 weeks

## 2014-06-25 NOTE — Patient Instructions (Signed)
Third Trimester of Pregnancy The third trimester is from week 29 through week 42, months 7 through 9. The third trimester is a time when the fetus is growing rapidly. At the end of the ninth month, the fetus is about 20 inches in length and weighs 6-10 pounds.  BODY CHANGES Your body goes through many changes during pregnancy. The changes vary from woman to woman.   Your weight will continue to increase. You can expect to gain 25-35 pounds (11-16 kg) by the end of the pregnancy.  You may begin to get stretch marks on your hips, abdomen, and breasts.  You may urinate more often because the fetus is moving lower into your pelvis and pressing on your bladder.  You may develop or continue to have heartburn as a result of your pregnancy.  You may develop constipation because certain hormones are causing the muscles that push waste through your intestines to slow down.  You may develop hemorrhoids or swollen, bulging veins (varicose veins).  You may have pelvic pain because of the weight gain and pregnancy hormones relaxing your joints between the bones in your pelvis. Backaches may result from overexertion of the muscles supporting your posture.  You may have changes in your Trimble. These can include thickening of your Erion, rapid growth, and changes in texture. Some women also have Mesa loss during or after pregnancy, or Yadao that feels dry or thin. Your Camargo will most likely return to normal after your baby is born.  Your breasts will continue to grow and be tender. A yellow discharge may leak from your breasts called colostrum.  Your belly button may stick out.  You may feel short of breath because of your expanding uterus.  You may notice the fetus "dropping," or moving lower in your abdomen.  You may have a bloody mucus discharge. This usually occurs a few days to a week before labor begins.  Your cervix becomes thin and soft (effaced) near your due date. WHAT TO EXPECT AT YOUR PRENATAL  EXAMS  You will have prenatal exams every 2 weeks until week 36. Then, you will have weekly prenatal exams. During a routine prenatal visit:  You will be weighed to make sure you and the fetus are growing normally.  Your blood pressure is taken.  Your abdomen will be measured to track your baby's growth.  The fetal heartbeat will be listened to.  Any test results from the previous visit will be discussed.  You may have a cervical check near your due date to see if you have effaced. At around 36 weeks, your caregiver will check your cervix. At the same time, your caregiver will also perform a test on the secretions of the vaginal tissue. This test is to determine if a type of bacteria, Group B streptococcus, is present. Your caregiver will explain this further. Your caregiver may ask you:  What your birth plan is.  How you are feeling.  If you are feeling the baby move.  If you have had any abnormal symptoms, such as leaking fluid, bleeding, severe headaches, or abdominal cramping.  If you have any questions. Other tests or screenings that may be performed during your third trimester include:  Blood tests that check for low iron levels (anemia).  Fetal testing to check the health, activity level, and growth of the fetus. Testing is done if you have certain medical conditions or if there are problems during the pregnancy. FALSE LABOR You may feel small, irregular contractions that   eventually go away. These are called Braxton Hicks contractions, or false labor. Contractions may last for hours, days, or even weeks before true labor sets in. If contractions come at regular intervals, intensify, or become painful, it is best to be seen by your caregiver.  SIGNS OF LABOR   Menstrual-like cramps.  Contractions that are 5 minutes apart or less.  Contractions that start on the top of the uterus and spread down to the lower abdomen and back.  A sense of increased pelvic pressure or back  pain.  A watery or bloody mucus discharge that comes from the vagina. If you have any of these signs before the 37th week of pregnancy, call your caregiver right away. You need to go to the hospital to get checked immediately. HOME CARE INSTRUCTIONS   Avoid all smoking, herbs, alcohol, and unprescribed drugs. These chemicals affect the formation and growth of the baby.  Follow your caregiver's instructions regarding medicine use. There are medicines that are either safe or unsafe to take during pregnancy.  Exercise only as directed by your caregiver. Experiencing uterine cramps is a good sign to stop exercising.  Continue to eat regular, healthy meals.  Wear a good support bra for breast tenderness.  Do not use hot tubs, steam rooms, or saunas.  Wear your seat belt at all times when driving.  Avoid raw meat, uncooked cheese, cat litter boxes, and soil used by cats. These carry germs that can cause birth defects in the baby.  Take your prenatal vitamins.  Try taking a stool softener (if your caregiver approves) if you develop constipation. Eat more high-fiber foods, such as fresh vegetables or fruit and whole grains. Drink plenty of fluids to keep your urine clear or pale yellow.  Take warm sitz baths to soothe any pain or discomfort caused by hemorrhoids. Use hemorrhoid cream if your caregiver approves.  If you develop varicose veins, wear support hose. Elevate your feet for 15 minutes, 3-4 times a day. Limit salt in your diet.  Avoid heavy lifting, wear low heal shoes, and practice good posture.  Rest a lot with your legs elevated if you have leg cramps or low back pain.  Visit your dentist if you have not gone during your pregnancy. Use a soft toothbrush to brush your teeth and be gentle when you floss.  A sexual relationship may be continued unless your caregiver directs you otherwise.  Do not travel far distances unless it is absolutely necessary and only with the approval  of your caregiver.  Take prenatal classes to understand, practice, and ask questions about the labor and delivery.  Make a trial run to the hospital.  Pack your hospital bag.  Prepare the baby's nursery.  Continue to go to all your prenatal visits as directed by your caregiver. SEEK MEDICAL CARE IF:  You are unsure if you are in labor or if your water has broken.  You have dizziness.  You have mild pelvic cramps, pelvic pressure, or nagging pain in your abdominal area.  You have persistent nausea, vomiting, or diarrhea.  You have a bad smelling vaginal discharge.  You have pain with urination. SEEK IMMEDIATE MEDICAL CARE IF:   You have a fever.  You are leaking fluid from your vagina.  You have spotting or bleeding from your vagina.  You have severe abdominal cramping or pain.  You have rapid weight loss or gain.  You have shortness of breath with chest pain.  You notice sudden or extreme swelling   of your face, hands, ankles, feet, or legs.  You have not felt your baby move in over an hour.  You have severe headaches that do not go away with medicine.  You have vision changes. Document Released: 03/29/2001 Document Revised: 04/09/2013 Document Reviewed: 06/05/2012 ExitCare Patient Information 2015 [employer]. This information is not intended to replace advice given to you by your health care provider. Make sure you discuss any questions you have with your health care provider.  

## 2014-06-26 LAB — RPR

## 2014-06-26 LAB — HIV ANTIBODY (ROUTINE TESTING W REFLEX): HIV 1&2 Ab, 4th Generation: NONREACTIVE

## 2014-06-26 LAB — GLUCOSE TOLERANCE, 1 HOUR (50G) W/O FASTING: Glucose, 1 Hour GTT: 114 mg/dL (ref 70–140)

## 2014-07-09 ENCOUNTER — Ambulatory Visit (INDEPENDENT_AMBULATORY_CARE_PROVIDER_SITE_OTHER): Payer: BLUE CROSS/BLUE SHIELD | Admitting: Advanced Practice Midwife

## 2014-07-09 VITALS — BP 137/60 | HR 94 | Temp 97.7°F | Wt 180.1 lb

## 2014-07-09 DIAGNOSIS — Z3482 Encounter for supervision of other normal pregnancy, second trimester: Secondary | ICD-10-CM

## 2014-07-09 DIAGNOSIS — Z3492 Encounter for supervision of normal pregnancy, unspecified, second trimester: Secondary | ICD-10-CM

## 2014-07-09 LAB — POCT URINALYSIS DIP (DEVICE)
BILIRUBIN URINE: NEGATIVE
GLUCOSE, UA: NEGATIVE mg/dL
Hgb urine dipstick: NEGATIVE
Nitrite: NEGATIVE
PROTEIN: NEGATIVE mg/dL
SPECIFIC GRAVITY, URINE: 1.02 (ref 1.005–1.030)
UROBILINOGEN UA: 0.2 mg/dL (ref 0.0–1.0)
pH: 7 (ref 5.0–8.0)

## 2014-07-09 NOTE — Progress Notes (Signed)
Doing well.  Good fetal movement, denies vaginal bleeding, LOF, regular contractions.  Anticipatory guidance regarding next visits given.

## 2014-07-23 ENCOUNTER — Encounter: Payer: Self-pay | Admitting: Physician Assistant

## 2014-07-23 ENCOUNTER — Ambulatory Visit (INDEPENDENT_AMBULATORY_CARE_PROVIDER_SITE_OTHER): Payer: BLUE CROSS/BLUE SHIELD | Admitting: Physician Assistant

## 2014-07-23 VITALS — BP 111/64 | HR 80 | Temp 97.8°F | Wt 179.2 lb

## 2014-07-23 DIAGNOSIS — Z3403 Encounter for supervision of normal first pregnancy, third trimester: Secondary | ICD-10-CM

## 2014-07-23 LAB — POCT URINALYSIS DIP (DEVICE)
BILIRUBIN URINE: NEGATIVE
GLUCOSE, UA: NEGATIVE mg/dL
Hgb urine dipstick: NEGATIVE
KETONES UR: NEGATIVE mg/dL
Nitrite: NEGATIVE
Protein, ur: NEGATIVE mg/dL
SPECIFIC GRAVITY, URINE: 1.015 (ref 1.005–1.030)
Urobilinogen, UA: 0.2 mg/dL (ref 0.0–1.0)
pH: 7 (ref 5.0–8.0)

## 2014-07-23 NOTE — Progress Notes (Signed)
32 weeks, stable.  Endorses good FM.  Denies LOF, VB, dysuria.   RTC 2 weeks

## 2014-07-23 NOTE — Patient Instructions (Signed)
Braxton Hicks Contractions °Contractions of the uterus can occur throughout pregnancy. Contractions are not always a sign that you are in labor.  °WHAT ARE BRAXTON HICKS CONTRACTIONS?  °Contractions that occur before labor are called Braxton Hicks contractions, or false labor. Toward the end of pregnancy (32-34 weeks), these contractions can develop more often and may become more forceful. This is not true labor because these contractions do not result in opening (dilatation) and thinning of the cervix. They are sometimes difficult to tell apart from true labor because these contractions can be forceful and people have different pain tolerances. You should not feel embarrassed if you go to the hospital with false labor. Sometimes, the only way to tell if you are in true labor is for your health care provider to look for changes in the cervix. °If there are no prenatal problems or other health problems associated with the pregnancy, it is completely safe to be sent home with false labor and await the onset of true labor. °HOW CAN YOU TELL THE DIFFERENCE BETWEEN TRUE AND FALSE LABOR? °False Labor °· The contractions of false labor are usually shorter and not as hard as those of true labor.   °· The contractions are usually irregular.   °· The contractions are often felt in the front of the lower abdomen and in the groin.   °· The contractions may go away when you walk around or change positions while lying down.   °· The contractions get weaker and are shorter lasting as time goes on.   °· The contractions do not usually become progressively stronger, regular, and closer together as with true labor.   °True Labor °· Contractions in true labor last 30-70 seconds, become very regular, usually become more intense, and increase in frequency.   °· The contractions do not go away with walking.   °· The discomfort is usually felt in the top of the uterus and spreads to the lower abdomen and low back.   °· True labor can be  determined by your health care provider with an exam. This will show that the cervix is dilating and getting thinner.   °WHAT TO REMEMBER °· Keep up with your usual exercises and follow other instructions given by your health care provider.   °· Take medicines as directed by your health care provider.   °· Keep your regular prenatal appointments.   °· Eat and drink lightly if you think you are going into labor.   °· If Braxton Hicks contractions are making you uncomfortable:   °¨ Change your position from lying down or resting to walking, or from walking to resting.   °¨ Sit and rest in a tub of warm water.   °¨ Drink 2-3 glasses of water. Dehydration may cause these contractions.   °¨ Do slow and deep breathing several times an hour.   °WHEN SHOULD I SEEK IMMEDIATE MEDICAL CARE? °Seek immediate medical care if: °· Your contractions become stronger, more regular, and closer together.   °· You have fluid leaking or gushing from your vagina.   °· You have a fever.   °· You pass blood-tinged mucus.   °· You have vaginal bleeding.   °· You have continuous abdominal pain.   °· You have low back pain that you never had before.   °· You feel your baby's head pushing down and causing pelvic pressure.   °· Your baby is not moving as much as it used to.   °Document Released: 04/04/2005 Document Revised: 04/09/2013 Document Reviewed: 01/14/2013 °ExitCare® Patient Information ©2015 [employer]. This information is not intended to replace advice given to you by your health care   provider. Make sure you discuss any questions you have with your health care provider. ° °

## 2014-08-06 ENCOUNTER — Ambulatory Visit (INDEPENDENT_AMBULATORY_CARE_PROVIDER_SITE_OTHER): Payer: BLUE CROSS/BLUE SHIELD | Admitting: Advanced Practice Midwife

## 2014-08-06 VITALS — BP 122/65 | HR 82 | Temp 97.7°F | Wt 183.3 lb

## 2014-08-06 DIAGNOSIS — Z3492 Encounter for supervision of normal pregnancy, unspecified, second trimester: Secondary | ICD-10-CM

## 2014-08-06 DIAGNOSIS — Z3482 Encounter for supervision of other normal pregnancy, second trimester: Secondary | ICD-10-CM

## 2014-08-06 LAB — POCT URINALYSIS DIP (DEVICE)
Bilirubin Urine: NEGATIVE
Glucose, UA: NEGATIVE mg/dL
Hgb urine dipstick: NEGATIVE
Ketones, ur: NEGATIVE mg/dL
Nitrite: NEGATIVE
PROTEIN: NEGATIVE mg/dL
Specific Gravity, Urine: 1.015 (ref 1.005–1.030)
UROBILINOGEN UA: 0.2 mg/dL (ref 0.0–1.0)
pH: 7 (ref 5.0–8.0)

## 2014-08-06 NOTE — Progress Notes (Signed)
Urinalysis shows moderate leukocytes.  

## 2014-08-06 NOTE — Progress Notes (Signed)
Doing well, c/o pain while getting out of bed, discussed round ligament pain with pt, recently bought pregnancy support belt. No LOF, VB, dysuria, or other complaints. RTC in two weeks for GBS and G/C. Anticipatory guidance regarding next visit given.

## 2014-08-20 ENCOUNTER — Ambulatory Visit (INDEPENDENT_AMBULATORY_CARE_PROVIDER_SITE_OTHER): Payer: BLUE CROSS/BLUE SHIELD | Admitting: Advanced Practice Midwife

## 2014-08-20 VITALS — BP 114/73 | HR 79 | Temp 97.6°F | Wt 184.8 lb

## 2014-08-20 DIAGNOSIS — Z3482 Encounter for supervision of other normal pregnancy, second trimester: Secondary | ICD-10-CM

## 2014-08-20 DIAGNOSIS — Z3492 Encounter for supervision of normal pregnancy, unspecified, second trimester: Secondary | ICD-10-CM

## 2014-08-20 LAB — OB RESULTS CONSOLE GC/CHLAMYDIA
Chlamydia: NEGATIVE
Gonorrhea: NEGATIVE

## 2014-08-20 LAB — POCT URINALYSIS DIP (DEVICE)
Bilirubin Urine: NEGATIVE
Glucose, UA: NEGATIVE mg/dL
Hgb urine dipstick: NEGATIVE
Ketones, ur: NEGATIVE mg/dL
Nitrite: NEGATIVE
PH: 7 (ref 5.0–8.0)
Protein, ur: NEGATIVE mg/dL
Specific Gravity, Urine: 1.02 (ref 1.005–1.030)
Urobilinogen, UA: 0.2 mg/dL (ref 0.0–1.0)

## 2014-08-20 LAB — OB RESULTS CONSOLE GBS: STREP GROUP B AG: NEGATIVE

## 2014-08-20 NOTE — Patient Instructions (Signed)
Braxton Hicks Contractions °Contractions of the uterus can occur throughout pregnancy. Contractions are not always a sign that you are in labor.  °WHAT ARE BRAXTON HICKS CONTRACTIONS?  °Contractions that occur before labor are called Braxton Hicks contractions, or false labor. Toward the end of pregnancy (32-34 weeks), these contractions can develop more often and may become more forceful. This is not true labor because these contractions do not result in opening (dilatation) and thinning of the cervix. They are sometimes difficult to tell apart from true labor because these contractions can be forceful and people have different pain tolerances. You should not feel embarrassed if you go to the hospital with false labor. Sometimes, the only way to tell if you are in true labor is for your health care provider to look for changes in the cervix. °If there are no prenatal problems or other health problems associated with the pregnancy, it is completely safe to be sent home with false labor and await the onset of true labor. °HOW CAN YOU TELL THE DIFFERENCE BETWEEN TRUE AND FALSE LABOR? °False Labor °· The contractions of false labor are usually shorter and not as hard as those of true labor.   °· The contractions are usually irregular.   °· The contractions are often felt in the front of the lower abdomen and in the groin.   °· The contractions may go away when you walk around or change positions while lying down.   °· The contractions get weaker and are shorter lasting as time goes on.   °· The contractions do not usually become progressively stronger, regular, and closer together as with true labor.   °True Labor °1. Contractions in true labor last 30-70 seconds, become very regular, usually become more intense, and increase in frequency.   °2. The contractions do not go away with walking.   °3. The discomfort is usually felt in the top of the uterus and spreads to the lower abdomen and low back.   °4. True labor can  be determined by your health care provider with an exam. This will show that the cervix is dilating and getting thinner.   °WHAT TO REMEMBER °· Keep up with your usual exercises and follow other instructions given by your health care provider.   °· Take medicines as directed by your health care provider.   °· Keep your regular prenatal appointments.   °· Eat and drink lightly if you think you are going into labor.   °· If Braxton Hicks contractions are making you uncomfortable:   °· Change your position from lying down or resting to walking, or from walking to resting.   °· Sit and rest in a tub of warm water.   °· Drink 2-3 glasses of water. Dehydration may cause these contractions.   °· Do slow and deep breathing several times an hour.   °WHEN SHOULD I SEEK IMMEDIATE MEDICAL CARE? °Seek immediate medical care if: °· Your contractions become stronger, more regular, and closer together.   °· You have fluid leaking or gushing from your vagina.   °· You have a fever.   °· You pass blood-tinged mucus.   °· You have vaginal bleeding.   °· You have continuous abdominal pain.   °· You have low back pain that you never had before.   °· You feel your baby's head pushing down and causing pelvic pressure.   °· Your baby is not moving as much as it used to.   °Document Released: 04/04/2005 Document Revised: 04/09/2013 Document Reviewed: 01/14/2013 °[employer]® Patient Information ©2015 [employer]. This information is not intended to replace advice given to you by your health care   provider. Make sure you discuss any questions you have with your health care provider. ° °Fetal Movement Counts °Patient Name: __________________________________________________ Patient Due Date: ____________________ °Performing a fetal movement count is highly recommended in high-risk pregnancies, but it is good for every pregnant woman to do. Your health care provider may ask you to start counting fetal movements at 28 weeks of the pregnancy. Fetal  movements often increase: °· After eating a full meal. °· After physical activity. °· After eating or drinking something sweet or cold. °· At rest. °Pay attention to when you feel the baby is most active. This will help you notice a pattern of your baby's sleep and wake cycles and what factors contribute to an increase in fetal movement. It is important to perform a fetal movement count at the same time each day when your baby is normally most active.  °HOW TO COUNT FETAL MOVEMENTS °5. Find a quiet and comfortable area to sit or lie down on your left side. Lying on your left side provides the best blood and oxygen circulation to your baby. °6. Write down the day and time on a sheet of paper or in a journal. °7. Start counting kicks, flutters, swishes, rolls, or jabs in a 2-hour period. You should feel at least 10 movements within 2 hours. °8. If you do not feel 10 movements in 2 hours, wait 2-3 hours and count again. Look for a change in the pattern or not enough counts in 2 hours. °SEEK MEDICAL CARE IF: °· You feel less than 10 counts in 2 hours, tried twice. °· There is no movement in over an hour. °· The pattern is changing or taking longer each day to reach 10 counts in 2 hours. °· You feel the baby is not moving as he or she usually does. °Date: ____________ Movements: ____________ Start time: ____________ Finish time: ____________  °Date: ____________ Movements: ____________ Start time: ____________ Finish time: ____________ °Date: ____________ Movements: ____________ Start time: ____________ Finish time: ____________ °Date: ____________ Movements: ____________ Start time: ____________ Finish time: ____________ °Date: ____________ Movements: ____________ Start time: ____________ Finish time: ____________ °Date: ____________ Movements: ____________ Start time: ____________ Finish time: ____________ °Date: ____________ Movements: ____________ Start time: ____________ Finish time: ____________ °Date: ____________  Movements: ____________ Start time: ____________ Finish time: ____________  °Date: ____________ Movements: ____________ Start time: ____________ Finish time: ____________ °Date: ____________ Movements: ____________ Start time: ____________ Finish time: ____________ °Date: ____________ Movements: ____________ Start time: ____________ Finish time: ____________ °Date: ____________ Movements: ____________ Start time: ____________ Finish time: ____________ °Date: ____________ Movements: ____________ Start time: ____________ Finish time: ____________ °Date: ____________ Movements: ____________ Start time: ____________ Finish time: ____________ °Date: ____________ Movements: ____________ Start time: ____________ Finish time: ____________  °Date: ____________ Movements: ____________ Start time: ____________ Finish time: ____________ °Date: ____________ Movements: ____________ Start time: ____________ Finish time: ____________ °Date: ____________ Movements: ____________ Start time: ____________ Finish time: ____________ °Date: ____________ Movements: ____________ Start time: ____________ Finish time: ____________ °Date: ____________ Movements: ____________ Start time: ____________ Finish time: ____________ °Date: ____________ Movements: ____________ Start time: ____________ Finish time: ____________ °Date: ____________ Movements: ____________ Start time: ____________ Finish time: ____________  °Date: ____________ Movements: ____________ Start time: ____________ Finish time: ____________ °Date: ____________ Movements: ____________ Start time: ____________ Finish time: ____________ °Date: ____________ Movements: ____________ Start time: ____________ Finish time: ____________ °Date: ____________ Movements: ____________ Start time: ____________ Finish time: ____________ °Date: ____________ Movements: ____________ Start time: ____________ Finish time: ____________ °Date: ____________ Movements: ____________ Start time:  ____________ Finish time: ____________ °Date: ____________ Movements:   ____________ Start time: ____________ Finish time: ____________  °Date: ____________ Movements: ____________ Start time: ____________ Finish time: ____________ °Date: ____________ Movements: ____________ Start time: ____________ Finish time: ____________ °Date: ____________ Movements: ____________ Start time: ____________ Finish time: ____________ °Date: ____________ Movements: ____________ Start time: ____________ Finish time: ____________ °Date: ____________ Movements: ____________ Start time: ____________ Finish time: ____________ °Date: ____________ Movements: ____________ Start time: ____________ Finish time: ____________ °Date: ____________ Movements: ____________ Start time: ____________ Finish time: ____________  °Date: ____________ Movements: ____________ Start time: ____________ Finish time: ____________ °Date: ____________ Movements: ____________ Start time: ____________ Finish time: ____________ °Date: ____________ Movements: ____________ Start time: ____________ Finish time: ____________ °Date: ____________ Movements: ____________ Start time: ____________ Finish time: ____________ °Date: ____________ Movements: ____________ Start time: ____________ Finish time: ____________ °Date: ____________ Movements: ____________ Start time: ____________ Finish time: ____________ °Date: ____________ Movements: ____________ Start time: ____________ Finish time: ____________  °Date: ____________ Movements: ____________ Start time: ____________ Finish time: ____________ °Date: ____________ Movements: ____________ Start time: ____________ Finish time: ____________ °Date: ____________ Movements: ____________ Start time: ____________ Finish time: ____________ °Date: ____________ Movements: ____________ Start time: ____________ Finish time: ____________ °Date: ____________ Movements: ____________ Start time: ____________ Finish time: ____________ °Date:  ____________ Movements: ____________ Start time: ____________ Finish time: ____________ °Date: ____________ Movements: ____________ Start time: ____________ Finish time: ____________  °Date: ____________ Movements: ____________ Start time: ____________ Finish time: ____________ °Date: ____________ Movements: ____________ Start time: ____________ Finish time: ____________ °Date: ____________ Movements: ____________ Start time: ____________ Finish time: ____________ °Date: ____________ Movements: ____________ Start time: ____________ Finish time: ____________ °Date: ____________ Movements: ____________ Start time: ____________ Finish time: ____________ °Date: ____________ Movements: ____________ Start time: ____________ Finish time: ____________ °Document Released: 05/04/2006 Document Revised: 08/19/2013 Document Reviewed: 01/30/2012 °[employer]® Patient Information ©2015 [employer]. This information is not intended to replace advice given to you by your health care provider. Make sure you discuss any questions you have with your health care provider. ° °

## 2014-08-20 NOTE — Progress Notes (Signed)
GBS, cultures today.  

## 2014-08-20 NOTE — Progress Notes (Signed)
Large Leukocytes in Urine.

## 2014-08-22 LAB — GC/CHLAMYDIA PROBE AMP
CT Probe RNA: NEGATIVE
GC Probe RNA: NEGATIVE

## 2014-08-22 LAB — CULTURE, BETA STREP (GROUP B ONLY)

## 2014-08-28 ENCOUNTER — Ambulatory Visit (INDEPENDENT_AMBULATORY_CARE_PROVIDER_SITE_OTHER): Payer: 59 | Admitting: Certified Nurse Midwife

## 2014-08-28 VITALS — BP 118/76 | HR 92 | Temp 97.7°F | Wt 185.9 lb

## 2014-08-28 DIAGNOSIS — Z3493 Encounter for supervision of normal pregnancy, unspecified, third trimester: Secondary | ICD-10-CM

## 2014-08-28 LAB — POCT URINALYSIS DIP (DEVICE)
Bilirubin Urine: NEGATIVE
Glucose, UA: NEGATIVE mg/dL
Hgb urine dipstick: NEGATIVE
Ketones, ur: NEGATIVE mg/dL
Nitrite: NEGATIVE
Protein, ur: NEGATIVE mg/dL
Specific Gravity, Urine: 1.015 (ref 1.005–1.030)
Urobilinogen, UA: 0.2 mg/dL (ref 0.0–1.0)
pH: 7 (ref 5.0–8.0)

## 2014-08-28 NOTE — Progress Notes (Signed)
Denies UTI symptoms. Doing well. Labor precautions. Advised miralax for constipation

## 2014-08-28 NOTE — Progress Notes (Signed)
C/o constipation and hemorrhoids. Discussed otc meds per protocol that she can use. Large leukocytes noted on urinalysis.

## 2014-08-28 NOTE — Patient Instructions (Signed)
Third Trimester of Pregnancy The third trimester is from week 29 through week 42, months 7 through 9. The third trimester is a time when the fetus is growing rapidly. At the end of the ninth month, the fetus is about 20 inches in length and weighs 6-10 pounds.  BODY CHANGES Your body goes through many changes during pregnancy. The changes vary from woman to woman.   Your weight will continue to increase. You can expect to gain 25-35 pounds (11-16 kg) by the end of the pregnancy.  You may begin to get stretch marks on your hips, abdomen, and breasts.  You may urinate more often because the fetus is moving lower into your pelvis and pressing on your bladder.  You may develop or continue to have heartburn as a result of your pregnancy.  You may develop constipation because certain hormones are causing the muscles that push waste through your intestines to slow down.  You may develop hemorrhoids or swollen, bulging veins (varicose veins).  You may have pelvic pain because of the weight gain and pregnancy hormones relaxing your joints between the bones in your pelvis. Backaches may result from overexertion of the muscles supporting your posture.  You may have changes in your [place]. These can include thickening of your Sem[place], rapid growth, and changes in texture. Some women also have Sweeten loss during or after pregnancy, or [place] that feels dry or thin. Your Koenen will most likely return to normal after your baby is born.  Your breasts will continue to grow and be tender. A yellow discharge may leak from your breasts called colostrum.  Your belly button may stick out.  You may feel short of breath because of your expanding uterus.  You may notice the fetus "dropping," or moving lower in your abdomen.  You may have a bloody mucus discharge. This usually occurs a few days to a week before labor begins.  Your cervix becomes thin and soft (effaced) near your due date. WHAT TO EXPECT AT YOUR PRENATAL  EXAMS  You will have prenatal exams every 2 weeks until week 36. Then, you will have weekly prenatal exams. During a routine prenatal visit:  You will be weighed to make sure you and the fetus are growing normally.  Your blood pressure is taken.  Your abdomen will be measured to track your baby's growth.  The fetal heartbeat will be listened to.  Any test results from the previous visit will be discussed.  You may have a cervical check near your due date to see if you have effaced. At around 36 weeks, your caregiver will check your cervix. At the same time, your caregiver will also perform a test on the secretions of the vaginal tissue. This test is to determine if a type of bacteria, Group B streptococcus, is present. Your caregiver will explain this further. Your caregiver may ask you:  What your birth plan is.  How you are feeling.  If you are feeling the baby move.  If you have had any abnormal symptoms, such as leaking fluid, bleeding, severe headaches, or abdominal cramping.  If you have any questions. Other tests or screenings that may be performed during your third trimester include:  Blood tests that check for low iron levels (anemia).  Fetal testing to check the health, activity level, and growth of the fetus. Testing is done if you have certain medical conditions or if there are problems during the pregnancy. FALSE LABOR You may feel small, irregular contractions that   eventually go away. These are called Braxton Hicks contractions, or false labor. Contractions may last for hours, days, or even weeks before true labor sets in. If contractions come at regular intervals, intensify, or become painful, it is best to be seen by your caregiver.  SIGNS OF LABOR   Menstrual-like cramps.  Contractions that are 5 minutes apart or less.  Contractions that start on the top of the uterus and spread down to the lower abdomen and back.  A sense of increased pelvic pressure or back  pain.  A watery or bloody mucus discharge that comes from the vagina. If you have any of these signs before the 37th week of pregnancy, call your caregiver right away. You need to go to the hospital to get checked immediately. HOME CARE INSTRUCTIONS   Avoid all smoking, herbs, alcohol, and unprescribed drugs. These chemicals affect the formation and growth of the baby.  Follow your caregiver's instructions regarding medicine use. There are medicines that are either safe or unsafe to take during pregnancy.  Exercise only as directed by your caregiver. Experiencing uterine cramps is a good sign to stop exercising.  Continue to eat regular, healthy meals.  Wear a good support bra for breast tenderness.  Do not use hot tubs, steam rooms, or saunas.  Wear your seat belt at all times when driving.  Avoid raw meat, uncooked cheese, cat litter boxes, and soil used by cats. These carry germs that can cause birth defects in the baby.  Take your prenatal vitamins.  Try taking a stool softener (if your caregiver approves) if you develop constipation. Eat more high-fiber foods, such as fresh vegetables or fruit and whole grains. Drink plenty of fluids to keep your urine clear or pale yellow.  Take warm sitz baths to soothe any pain or discomfort caused by hemorrhoids. Use hemorrhoid cream if your caregiver approves.  If you develop varicose veins, wear support hose. Elevate your feet for 15 minutes, 3-4 times a day. Limit salt in your diet.  Avoid heavy lifting, wear low heal shoes, and practice good posture.  Rest a lot with your legs elevated if you have leg cramps or low back pain.  Visit your dentist if you have not gone during your pregnancy. Use a soft toothbrush to brush your teeth and be gentle when you floss.  A sexual relationship may be continued unless your caregiver directs you otherwise.  Do not travel far distances unless it is absolutely necessary and only with the approval  of your caregiver.  Take prenatal classes to understand, practice, and ask questions about the labor and delivery.  Make a trial run to the hospital.  Pack your hospital bag.  Prepare the baby's nursery.  Continue to go to all your prenatal visits as directed by your caregiver. SEEK MEDICAL CARE IF:  You are unsure if you are in labor or if your water has broken.  You have dizziness.  You have mild pelvic cramps, pelvic pressure, or nagging pain in your abdominal area.  You have persistent nausea, vomiting, or diarrhea.  You have a bad smelling vaginal discharge.  You have pain with urination. SEEK IMMEDIATE MEDICAL CARE IF:   You have a fever.  You are leaking fluid from your vagina.  You have spotting or bleeding from your vagina.  You have severe abdominal cramping or pain.  You have rapid weight loss or gain.  You have shortness of breath with chest pain.  You notice sudden or extreme swelling   of your face, hands, ankles, feet, or legs.  You have not felt your baby move in over an hour.  You have severe headaches that do not go away with medicine.  You have vision changes. Document Released: 03/29/2001 Document Revised: 04/09/2013 Document Reviewed: 06/05/2012 [employer] Patient Information 2015 [employer]. This information is not intended to replace advice given to you by your health care provider. Make sure you discuss any questions you have with your health care provider.  

## 2014-09-04 ENCOUNTER — Ambulatory Visit (INDEPENDENT_AMBULATORY_CARE_PROVIDER_SITE_OTHER): Payer: 59 | Admitting: Advanced Practice Midwife

## 2014-09-04 ENCOUNTER — Encounter: Payer: Self-pay | Admitting: Advanced Practice Midwife

## 2014-09-04 VITALS — BP 114/82 | HR 92 | Wt 186.7 lb

## 2014-09-04 DIAGNOSIS — Z3403 Encounter for supervision of normal first pregnancy, third trimester: Secondary | ICD-10-CM

## 2014-09-04 LAB — POCT URINALYSIS DIP (DEVICE)
BILIRUBIN URINE: NEGATIVE
GLUCOSE, UA: NEGATIVE mg/dL
Hgb urine dipstick: NEGATIVE
KETONES UR: NEGATIVE mg/dL
NITRITE: NEGATIVE
PH: 7 (ref 5.0–8.0)
Protein, ur: NEGATIVE mg/dL
Specific Gravity, Urine: 1.02 (ref 1.005–1.030)
Urobilinogen, UA: 0.2 mg/dL (ref 0.0–1.0)

## 2014-09-04 NOTE — Progress Notes (Signed)
Moderate leukocytes in urine.

## 2014-09-04 NOTE — Patient Instructions (Signed)

## 2014-09-04 NOTE — Progress Notes (Signed)
Doing well. Not many contractions. Reviewed signs of labor.and how to come in. Vertex to [place], EFW 7.5

## 2014-09-06 ENCOUNTER — Encounter (HOSPITAL_COMMUNITY): Payer: Self-pay | Admitting: *Deleted

## 2014-09-06 ENCOUNTER — Inpatient Hospital Stay (HOSPITAL_COMMUNITY): Payer: 59 | Admitting: Anesthesiology

## 2014-09-06 ENCOUNTER — Inpatient Hospital Stay (HOSPITAL_COMMUNITY)
Admission: AD | Admit: 2014-09-06 | Discharge: 2014-09-08 | DRG: 775 | Disposition: A | Discharge status: Home / Self Care | Payer: 59 | Source: Ambulatory Visit | Attending: Obstetrics & Gynecology | Admitting: Obstetrics & Gynecology

## 2014-09-06 DIAGNOSIS — Z3A39 39 weeks gestation of pregnancy: Secondary | ICD-10-CM | POA: Diagnosis not present

## 2014-09-06 DIAGNOSIS — Z8249 Family history of ischemic heart disease and other diseases of the circulatory system: Secondary | ICD-10-CM

## 2014-09-06 DIAGNOSIS — Z3492 Encounter for supervision of normal pregnancy, unspecified, second trimester: Secondary | ICD-10-CM

## 2014-09-06 DIAGNOSIS — Z820 Family history of epilepsy and other diseases of the nervous system: Secondary | ICD-10-CM

## 2014-09-06 DIAGNOSIS — IMO0001 Reserved for inherently not codable concepts without codable children: Secondary | ICD-10-CM

## 2014-09-06 LAB — CBC
HEMATOCRIT: 37.6 % (ref 36.0–46.0)
Hemoglobin: 13.5 g/dL (ref 12.0–15.0)
MCH: 30.1 pg (ref 26.0–34.0)
MCHC: 35.9 g/dL (ref 30.0–36.0)
MCV: 83.9 fL (ref 78.0–100.0)
PLATELETS: 163 10*3/uL (ref 150–400)
RBC: 4.48 MIL/uL (ref 3.87–5.11)
RDW: 12.7 % (ref 11.5–15.5)
WBC: 12.7 10*3/uL — AB (ref 4.0–10.5)

## 2014-09-06 LAB — TYPE AND SCREEN
ABO/RH(D): O POS
ANTIBODY SCREEN: NEGATIVE

## 2014-09-06 LAB — ABO/RH: ABO/RH(D): O POS

## 2014-09-06 LAB — RPR: RPR Ser Ql: NONREACTIVE

## 2014-09-06 MED ORDER — EPHEDRINE 5 MG/ML INJ
10.0000 mg | INTRAVENOUS | Status: DC | PRN
  Filled 2014-09-06: qty 2

## 2014-09-06 MED ORDER — FENTANYL 2.5 MCG/ML BUPIVACAINE 1/10 % EPIDURAL INFUSION (WH - ANES)
14.0000 mL/h | INTRAMUSCULAR | Status: DC | PRN
  Administered 2014-09-06: 14 mL/h via EPIDURAL
  Filled 2014-09-06 (×2): qty 125

## 2014-09-06 MED ORDER — FLEET ENEMA 7-19 GM/118ML RE ENEM
1.0000 | ENEMA | RECTAL | Status: DC | PRN
Start: 2014-09-06 — End: 2014-09-07

## 2014-09-06 MED ORDER — LIDOCAINE HCL (PF) 1 % IJ SOLN
30.0000 mL | INTRAMUSCULAR | Status: DC | PRN
  Filled 2014-09-06: qty 30

## 2014-09-06 MED ORDER — OXYTOCIN BOLUS FROM INFUSION
500.0000 mL | INTRAVENOUS | Status: DC
  Administered 2014-09-06: 500 mL via INTRAVENOUS

## 2014-09-06 MED ORDER — OXYCODONE-ACETAMINOPHEN 5-325 MG PO TABS: 1.0000 | ORAL_TABLET | ORAL | Status: DC | PRN

## 2014-09-06 MED ORDER — OXYTOCIN 40 UNITS IN LACTATED RINGERS INFUSION - SIMPLE MED
1.0000 m[IU]/min | INTRAVENOUS | Status: DC
  Filled 2014-09-06: qty 1000

## 2014-09-06 MED ORDER — DIPHENHYDRAMINE HCL 50 MG/ML IJ SOLN: 12.5000 mg | INTRAMUSCULAR | Status: DC | PRN

## 2014-09-06 MED ORDER — FENTANYL 2.5 MCG/ML BUPIVACAINE 1/10 % EPIDURAL INFUSION (WH - ANES)
INTRAMUSCULAR | Status: DC | PRN
  Administered 2014-09-06: 14 mL/h via EPIDURAL

## 2014-09-06 MED ORDER — EPHEDRINE 5 MG/ML INJ
10.0000 mg | INTRAVENOUS | Status: DC | PRN
Start: 2014-09-06 — End: 2014-09-06

## 2014-09-06 MED ORDER — FENTANYL CITRATE (PF) 100 MCG/2ML IJ SOLN: 50.0000 ug | INTRAMUSCULAR | Status: DC | PRN

## 2014-09-06 MED ORDER — TERBUTALINE SULFATE 1 MG/ML IJ SOLN: 0.2500 mg | Freq: Once | INTRAMUSCULAR | Status: AC | PRN

## 2014-09-06 MED ORDER — OXYCODONE-ACETAMINOPHEN 5-325 MG PO TABS: 2.0000 | ORAL_TABLET | ORAL | Status: DC | PRN

## 2014-09-06 MED ORDER — PHENYLEPHRINE 40 MCG/ML (10ML) SYRINGE FOR IV PUSH (FOR BLOOD PRESSURE SUPPORT)
80.0000 ug | PREFILLED_SYRINGE | INTRAVENOUS | Status: DC | PRN
  Filled 2014-09-06: qty 20
  Filled 2014-09-06: qty 2

## 2014-09-06 MED ORDER — LACTATED RINGERS IV SOLN
INTRAVENOUS | Status: DC
  Administered 2014-09-06 (×2): via INTRAVENOUS

## 2014-09-06 MED ORDER — OXYTOCIN 40 UNITS IN LACTATED RINGERS INFUSION - SIMPLE MED: 62.5000 mL/h | INTRAVENOUS | Status: DC

## 2014-09-06 MED ORDER — ONDANSETRON HCL 4 MG/2ML IJ SOLN
4.0000 mg | Freq: Four times a day (QID) | INTRAMUSCULAR | Status: DC | PRN
  Administered 2014-09-06: 4 mg via INTRAVENOUS
  Filled 2014-09-06: qty 2

## 2014-09-06 MED ORDER — FENTANYL 2.5 MCG/ML BUPIVACAINE 1/10 % EPIDURAL INFUSION (WH - ANES): 14.0000 mL/h | INTRAMUSCULAR | Status: DC | PRN

## 2014-09-06 MED ORDER — CITRIC ACID-SODIUM CITRATE 334-500 MG/5ML PO SOLN: 30.0000 mL | ORAL | Status: DC | PRN

## 2014-09-06 MED ORDER — LIDOCAINE HCL (PF) 1 % IJ SOLN
INTRAMUSCULAR | Status: DC | PRN
  Administered 2014-09-06: 2 mL
  Administered 2014-09-06: 3 mL
  Administered 2014-09-06: 5 mL

## 2014-09-06 MED ORDER — LACTATED RINGERS IV SOLN
500.0000 mL | INTRAVENOUS | Status: DC | PRN
  Administered 2014-09-06: 1000 mL via INTRAVENOUS

## 2014-09-06 MED ORDER — PHENYLEPHRINE 40 MCG/ML (10ML) SYRINGE FOR IV PUSH (FOR BLOOD PRESSURE SUPPORT)
80.0000 ug | PREFILLED_SYRINGE | INTRAVENOUS | Status: DC | PRN
Start: 2014-09-06 — End: 2014-09-06

## 2014-09-06 MED ORDER — ACETAMINOPHEN 325 MG PO TABS: 650.0000 mg | ORAL_TABLET | ORAL | Status: DC | PRN

## 2014-09-06 NOTE — Progress Notes (Signed)
PT sits up with ctxs and records maternal HR with ctx. Repositioned

## 2014-09-06 NOTE — Progress Notes (Signed)
Pt sits up with ctxs and picks up maternal HR

## 2014-09-06 NOTE — MAU Note (Signed)
Contractions since 2200. Some bloody show. Denies LOF. Baby moving well.

## 2014-09-06 NOTE — Progress Notes (Signed)
Aware of pt's admission and status. Will watch an hour and reck.

## 2014-09-06 NOTE — H&P (Signed)
LABOR ADMISSION HISTORY AND PHYSICAL  Chandler Stofer is a 24 y.o. female G1P0000 with IUP at [redacted]w[redacted]d by LMP, presenting for SOL, intact membranes. - She reports feeling contractions started last night around 2200, previously only had occasional irregular non-painful braxton hicks ctx. Seems to be progressing to more painful q 2-3 min contractions. +FM - Admits nausea / vomiting (x1 prior to arrival, x 2 in MAU due to pain) - Denies LOF, no VB, no blurry vision, headaches or peripheral edema, and RUQ pain.  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 - Uncomplicated pregnancy - GBS negative  Past Medical History: Past Medical History  Diagnosis Date  . IBS (irritable bowel syndrome)     ?    Past Surgical History: Past Surgical History  Procedure Laterality Date  . Iud removal      skyla removed 7/14    Obstetrical History: OB History    Gravida Para Term Preterm AB TAB SAB Ectopic Multiple Living        Social History: History   Social History  . Marital Status: Married    Spouse Name: N/A  . Number of Children: N/A  . Years of Education: N/A   Social History Main Topics  . Smoking status: Never Smoker   . Smokeless tobacco: Never Used  . Alcohol Use: No  . Drug Use: No  . Sexual Activity:    Partners: Male    Birth Control/ Protection: None   Other Topics Concern  . None   Social History Narrative    Family History: Family History  Problem Relation Age of Onset  . Multiple sclerosis Mother   . Thyroid disease Mother   . Osteoporosis Mother   . Other Mother     liver problems  . Depression Father   . Anxiety disorder Father   . Cancer Paternal Grandfather     colon  . Heart attack Maternal  Grandmother   . Heart attack Maternal Grandfather     Allergies: No Known Allergies  Prescriptions prior to admission  Medication Sig Dispense Refill Last Dose  . Ascorbic Acid (VITAMIN C PO) Take by mouth as needed.   Past Month at Unknown time  . polyethylene glycol (MIRALAX / GLYCOLAX) packet Take 17 g by mouth daily.   Past Week at Unknown time  . Prenatal Vit-Fe Fumarate-FA (PRENATAL VITAMIN PO) Take by mouth daily.   09/05/2014 at Unknown time     Review of Systems   All systems reviewed and negative except as stated in HPI  BP 124/73 mmHg  Pulse 92  Temp(Src) 98 F (36.7 C)  Resp 18  Ht  (1.6 m)  Wt 84.369 kg (186 lb)  BMI 32.96 kg/m2  LMP 11/26/2013 (Approximate) General appearance: alert and cooperative, well-appearing, uncomfortable with contractions Lungs: CTAB, good air movement Heart: regular rate and rhythm, no murmurs Abdomen: soft, non-tender; bowel sounds normal, appropriately gravid for GA Extremities: Homans sign is negative, no sign of DVT, edema DTR's +2 Presentation: cephalic on SVE, with scalp stim positive Fetal monitoringBaseline: 135-140 bpm, Variability: Good {> 6 bpm), Accelerations: Reactive (15x15s) and Decelerations: Absent Uterine activityFrequency: Every 2-5 minutes Dilation: 3.5 Effacement (%): 90 Station: -1 Exam by::  Quintella BatonJo  BArham rNC   Prenatal labs: ABO, Rh: O/POS/-- (01/13 1613) Antibody: NEG (01/13 1613) Rubella:  1.69, immune (1/13) RPR: NON REAC (03/09 1300)  HBsAg: NEGATIVE (01/13 1613)  HIV: NONREACTIVE (03/09 1300)  GBS: Negative (05/04 0000)  1 hr Glucola - 114 (3rd trimester) Genetic screening  declined Anatomy US normal at 21 wk (poor visualization of heart)  Prenatal Transfer Tool  Maternal Diabetes: No Genetic Screening: Declined  Maternal Ultrasounds/Referrals: Normal Fetal Ultrasounds or other Referrals:  None Maternal Substance Abuse:  No Significant Maternal Medications:  None Significant Maternal Lab  Results: Lab values include: Group B Strep negative  No results found for this or any previous visit (from the past 24 hour(s)).  Patient Active Problem List   Diagnosis Date Noted  . Significant discrepancy between uterine size and clinical dates, antepartum   . Supervision of normal pregnancy, antepartum   . Supervision of normal pregnancy in second trimester 04/30/2014  . Vaginal bleeding in pregnancy 04/30/2014    Assessment: Verne SpurrJessica Creger is a 24 y.o. G1P0000 at 6951w1d admitted for SOL at term, intact membranes. GBS negative. Uncomplicated pregnancy, primigravida.  #Labor: Expectant management. Cervix seems appropriately ripe. Consider Pit if needed to augment. #Pain: Fentanyl IV PRN, may have epidural #FWB:  Cat 1 #ID:  GBS negative #MOF:  Breast #MOC: Family planning #Circ:  No #Peds: [facility]  Saralyn PilarAlexander Karamalegos, DO [facility], PGY-2 09/06/14 at [phone]  Agree with note Cervix has changed somewhat with dilation and effacement Patient is increasingly uncomfortable with contractions Admit Aviva SignsMarie L Williams, CNM

## 2014-09-06 NOTE — Progress Notes (Signed)
Pt well relaxed. Husband supportive

## 2014-09-06 NOTE — Anesthesia Procedure Notes (Addendum)
Epidural Patient location during procedure: OB Start time: 09/06/2014 8:00 AM End time: 09/06/2014 8:15 AM  Staffing Anesthesiologist: Marcene DuosFITZGERALD, Chanya Chrisley Performed by: anesthesiologist   Preanesthetic Checklist Completed: patient identified, site marked, surgical consent, pre-op evaluation, timeout performed, IV checked, risks and benefits discussed and monitors and equipment checked  Epidural Patient position: sitting Prep: site prepped and draped and DuraPrep Patient monitoring: continuous pulse ox and blood pressure Approach: midline Location: L4-L5 Injection technique: LOR saline  Needle:  Needle type: Tuohy  Needle gauge: 17 G Needle length: 9 cm and 9 Needle insertion depth: 4 cm Catheter type: closed end flexible Catheter size: 19 Gauge Catheter at skin depth: 10 cm Test dose: negative  Assessment Sensory level: T10 Events: blood not aspirated, injection not painful, no injection resistance, negative IV test and no paresthesia  Additional Notes Risks and benefits discussed. Pt tolerated well with no immediate complications.

## 2014-09-06 NOTE — Progress Notes (Signed)
Labor Progress Note  Karen SpurrJessica Bauer is a 24 y.o. G1P0000 at 35103w1d  admitted for SOL at term, with intact membranes.  S: Pt has an epidural placed. Tolerating pressure well. Artificial rupture of membrane at 10:31AM.   O:  BP 131/79 mmHg  Pulse 112  Temp(Src) 97.8 F (36.6 C) (Oral)  Resp 18  Ht 5\' 3"  (1.6 m)  Wt 84.369 kg (186 lb)  BMI 32.96 kg/m2  SpO2 98%  LMP 11/26/2013 (Approximate)     FHT:  FHR: 135 bpm, variability: moderate,  accelerations:  Present,  decelerations:  Absent UC:   regular, every 4 minutes SVE:   Dilation: 6 Effacement (%): 100 Station: +1 Exam by:: Lakayla Barrington SROM/AROM: AROM at 10:31AM Clear fluid with meconium.  Dilation: 6 Effacement (%): 100 Cervical Position: Middle Station: +1 Presentation: Vertex Exam by:: Nasser Ku   Labs: Lab Results  Component Value Date   WBC 12.7* 09/06/2014   HGB 13.5 09/06/2014   HCT 37.6 09/06/2014   MCV 83.9 09/06/2014   PLT 163 09/06/2014    Assessment / Plan: 24 y.o. G1P0000 74103w1d in active labor.Spontaneous labor, progressing normally. AROM at 10:31AM. Will consider adding Pit if augmentation is needed.  Labor: Progressing normally Fetal Wellbeing:  Category I Pain Control:  Epidural Also consider IV fentanyl Anticipated MOD: Expectant management. SVD.  Forest BeckerEunice Yim, Medical Student  I spoke with and examined patient and agree with resident/PA/SNM's note and plan of care.  SVE: 6/90/-2 w/ BBOW, AROM'd w/ small amount Lt mec Cheral MarkerKimberly R. Khiara Shuping, CNM, WHNP-BC 09/06/2014 12:00 PM

## 2014-09-06 NOTE — Progress Notes (Signed)
Labor Progress Note  Karen SpurrJessica Ohlsen is a 24 y.o. G1P0000 at 6664w1d  admitted for SOL at term, with intact membranes. S: Pt reported continuous Nausea and vomited this PM. Not been able to keep fluids down. Zofran given by RN.  O:  BP 117/79 mmHg  Pulse 97  Temp(Src) 98.5 F (36.9 C) (Oral)  Resp 18  Ht 5\' 3"  (1.6 m)  Wt 84.369 kg (186 lb)  BMI 32.96 kg/m2  SpO2 98%  LMP 11/26/2013 (Approximate)    FHT:  FHR: 140 bpm, variability: moderate,  accelerations:  Present,  decelerations:  Absent UC:   regular, every 2 minutes SVE:   Dilation: 8.5 Effacement (%): 100 Station: +2 Exam by:: CongoLeigha Tietje RN  SROM/AROM: AROM this morning with clear fluid and meconium   Labs: Lab Results  Component Value Date   WBC 12.7* 09/06/2014   HGB 13.5 09/06/2014   HCT 37.6 09/06/2014   MCV 83.9 09/06/2014   PLT 163 09/06/2014    Assessment / Plan: 24 y.o. G1P0000 10464w1d active labor Spontaneous labor, progressing normally. Cervical dilation is close to complete but given Pt's N&V, will prepare for delivery once Pt's nausea improves and endorses more pressure from contractions.  Labor: Progressing normally. AROM at 10:31AM. Fetal Wellbeing:  Category I Pain Control:  Epidural Anticipated MOD:  Expectant management. SVD   Forest BeckerEunice Yim MS3  I spoke with and examined patient and agree with resident/PA/SNM's note and plan of care.  Cheral MarkerKimberly R. Delayna Sparlin, CNM, [facility] 09/06/2014 4:59 PM

## 2014-09-06 NOTE — MAU Note (Signed)
Pt vomited. Cool cloth to pt

## 2014-09-06 NOTE — Anesthesia Preprocedure Evaluation (Addendum)
Anesthesia Evaluation  Patient identified by MRN, date of birth, ID band Patient awake    Reviewed: Allergy & Precautions, NPO status , Patient's Chart, lab work & pertinent test results  Airway Mallampati: II  TM Distance: >3 FB Neck ROM: Full    Dental   Pulmonary neg pulmonary ROS,  breath sounds clear to auscultation        Cardiovascular negative cardio ROS  Rhythm:Regular Rate:Normal     Neuro/Psych negative neurological ROS     GI/Hepatic negative GI ROS, Neg liver ROS,   Endo/Other  negative endocrine ROS  Renal/GU negative Renal ROS     Musculoskeletal   Abdominal   Peds  Hematology Plts 163   Anesthesia Other Findings   Reproductive/Obstetrics                            Anesthesia Physical Anesthesia Plan  ASA: II  Anesthesia Plan: Epidural   Post-op Pain Management:    Induction:   Airway Management Planned:   Additional Equipment:   Intra-op Plan:   Post-operative Plan:   Informed Consent: I have reviewed the patients History and Physical, chart, labs and discussed the procedure including the risks, benefits and alternatives for the proposed anesthesia with the patient or authorized representative who has indicated his/her understanding and acceptance.     Plan Discussed with:   Anesthesia Plan Comments:         Anesthesia Quick Evaluation

## 2014-09-06 NOTE — Progress Notes (Signed)
DR Vella RedheadKLegos in to see pt

## 2014-09-06 NOTE — MAU Note (Signed)
[facility] OK to transport to [facility]

## 2014-09-06 NOTE — Progress Notes (Signed)
Dr Vella RedheadKLegos notified of pt's repeat sve. He will discuss with Wynelle BourgeoisMarie Williams CNM

## 2014-09-07 LAB — CBC
HCT: 32.9 % — ABNORMAL LOW (ref 36.0–46.0)
Hemoglobin: 11.5 g/dL — ABNORMAL LOW (ref 12.0–15.0)
MCH: 30 pg (ref 26.0–34.0)
MCHC: 35 g/dL (ref 30.0–36.0)
MCV: 85.9 fL (ref 78.0–100.0)
PLATELETS: 154 10*3/uL (ref 150–400)
RBC: 3.83 MIL/uL — ABNORMAL LOW (ref 3.87–5.11)
RDW: 12.9 % (ref 11.5–15.5)
WBC: 13.1 10*3/uL — AB (ref 4.0–10.5)

## 2014-09-07 MED ORDER — ONDANSETRON HCL 4 MG/2ML IJ SOLN: 4.0000 mg | INTRAMUSCULAR | Status: DC | PRN

## 2014-09-07 MED ORDER — WITCH HAZEL-GLYCERIN EX PADS: 1.0000 "application " | MEDICATED_PAD | CUTANEOUS | Status: DC | PRN

## 2014-09-07 MED ORDER — OXYCODONE-ACETAMINOPHEN 5-325 MG PO TABS: 2.0000 | ORAL_TABLET | ORAL | Status: DC | PRN

## 2014-09-07 MED ORDER — TETANUS-DIPHTH-ACELL PERTUSSIS 5-2.5-18.5 LF-MCG/0.5 IM SUSP: 0.5000 mL | Freq: Once | INTRAMUSCULAR | Status: DC

## 2014-09-07 MED ORDER — ACETAMINOPHEN 325 MG PO TABS
650.0000 mg | ORAL_TABLET | ORAL | Status: DC | PRN
Start: 2014-09-07 — End: 2014-09-08

## 2014-09-07 MED ORDER — IBUPROFEN 600 MG PO TABS
600.0000 mg | ORAL_TABLET | Freq: Four times a day (QID) | ORAL | Status: DC
  Administered 2014-09-07 – 2014-09-08 (×7): 600 mg via ORAL
  Filled 2014-09-07 (×7): qty 1

## 2014-09-07 MED ORDER — SENNOSIDES-DOCUSATE SODIUM 8.6-50 MG PO TABS
2.0000 | ORAL_TABLET | ORAL | Status: DC
  Administered 2014-09-07 (×2): 2 via ORAL
  Filled 2014-09-07 (×2): qty 2

## 2014-09-07 MED ORDER — OXYCODONE-ACETAMINOPHEN 5-325 MG PO TABS: 1.0000 | ORAL_TABLET | ORAL | Status: DC | PRN

## 2014-09-07 MED ORDER — BENZOCAINE-MENTHOL 20-0.5 % EX AERO
1.0000 "application " | INHALATION_SPRAY | CUTANEOUS | Status: DC | PRN
  Administered 2014-09-07: 1 via TOPICAL
  Filled 2014-09-07: qty 56

## 2014-09-07 MED ORDER — SIMETHICONE 80 MG PO CHEW: 80.0000 mg | CHEWABLE_TABLET | ORAL | Status: DC | PRN

## 2014-09-07 MED ORDER — DIBUCAINE 1 % RE OINT: 1.0000 "application " | TOPICAL_OINTMENT | RECTAL | Status: DC | PRN

## 2014-09-07 MED ORDER — DIPHENHYDRAMINE HCL 25 MG PO CAPS: 25.0000 mg | ORAL_CAPSULE | Freq: Four times a day (QID) | ORAL | Status: DC | PRN

## 2014-09-07 MED ORDER — ZOLPIDEM TARTRATE 5 MG PO TABS: 5.0000 mg | ORAL_TABLET | Freq: Every evening | ORAL | Status: DC | PRN

## 2014-09-07 MED ORDER — ONDANSETRON HCL 4 MG PO TABS: 4.0000 mg | ORAL_TABLET | ORAL | Status: DC | PRN

## 2014-09-07 MED ORDER — PRENATAL MULTIVITAMIN CH
1.0000 | ORAL_TABLET | Freq: Every day | ORAL | Status: DC
  Administered 2014-09-07 – 2014-09-08 (×2): 1 via ORAL
  Filled 2014-09-07 (×2): qty 1

## 2014-09-07 MED ORDER — LANOLIN HYDROUS EX OINT: TOPICAL_OINTMENT | CUTANEOUS | Status: DC | PRN

## 2014-09-07 NOTE — Progress Notes (Signed)
Post Partum Day #1 Subjective: no complaints, up ad lib, voiding and tolerating PO  Objective: Blood pressure 112/64, pulse 79, temperature 98.3 F (36.8 C), temperature source Oral, resp. rate 18, height 5\' 3"  (1.6 m), weight 84.369 kg (186 lb), last menstrual period 11/26/2013, SpO2 98 %, unknown if currently breastfeeding.  Physical Exam:  General: alert and cooperative, well-appearing, NAD Lochia: appropriate Uterine Fundus: firm U-1 DVT Evaluation: No evidence of DVT seen on physical exam. Negative Homan's sign. No cords or calf tenderness. No significant calf/ankle edema.   Recent Labs  09/06/14 0645 09/07/14 0618  HGB 13.5 11.5*  HCT 37.6 32.9*    Assessment/Plan: Karen Bauer is a 24 y.o. G1P1001 at 1784w1d s/p NSVD healthy baby boy on 09/06/14 at 2132. Delivery with 1st degree labial/perineal lac s/p repair. - Postpartum course uncomplicated. Maternal GBS negative - Pain controlled, bowel regimen PRN - Continue breast, lactation consult as needed - Contraception: natural family planning - No circ planned - Anticipate discharge 1-2 days   LOS: 1 day   Saralyn PilarAlexander Amir Glaus, DO [facility], PGY-2  09/07/2014, 7:10 AM

## 2014-09-07 NOTE — Anesthesia Postprocedure Evaluation (Signed)
Anesthesia Post Note  Patient: Karen SpurrJessica Sloane  Procedure(s) Performed: * No procedures listed *  Anesthesia type: Epidural  Patient location: Mother/Baby  Post pain: Pain level controlled  Post assessment: Post-op Vital signs reviewed  Last Vitals:  Filed Vitals:   09/07/14 0510  BP: 112/64  Pulse: 79  Temp: 36.8 C  Resp: 18    Post vital signs: Reviewed  Level of consciousness:alert  Complications: No apparent anesthesia complications

## 2014-09-07 NOTE — Lactation Note (Signed)
This note was copied from the chart of Karen Adiyah Bing. Lactation Consultation Note  Baby 17 hours old .  BFx4 with 3 additional attempts. Taught mother hand expression and gave baby a few drops on a spoon. Baby has disorganized suck and high palate.  Reviewed suck training with parents. Attempted latching in cross cradle, football and side lying.  Baby did not seem hungry. Suggest mother do STS and retry when baby cues and prepump with hand pump before latching. Reviewed basics, cluster feeding. Mom encouraged to feed baby 8-12 times/24 hours and with feeding cues.  Mom made aware of O/P services, breastfeeding support groups, community resources, and our phone # for post-discharge questions.    Patient Name: Karen Verne SpurrJessica Bauer Today's Date: 09/07/2014 Reason for consult: Initial assessment   Maternal Data    Feeding Length of feed: 2 min  LATCH Score/Interventions Latch: Repeated attempts needed to sustain latch, nipple held in mouth throughout feeding, stimulation needed to elicit sucking reflex. Intervention(s): Adjust position;Assist with latch (hand pump for sl flat nipples)  Audible Swallowing: None Intervention(s): Hand expression  Type of Nipple: Everted at rest and after stimulation (sl flat, more erect with feedings)  Comfort (Breast/Nipple): Soft / non-tender     Hold (Positioning): Assistance needed to correctly position infant at breast and maintain latch. Intervention(s): Breastfeeding basics reviewed;Support Pillows;Position options  LATCH Score: 6  Lactation Tools Discussed/Used     Consult Status Consult Status: Follow-up Date: 09/07/14 Follow-up type: In-patient    Dahlia ByesBerkelhammer, Ruth [facility] 09/07/2014, 2:52 PM

## 2014-09-08 MED ORDER — IBUPROFEN 600 MG PO TABS: 600.0000 mg | ORAL_TABLET | Freq: Four times a day (QID) | ORAL | Status: AC

## 2014-09-08 MED ORDER — OXYCODONE-ACETAMINOPHEN 5-325 MG PO TABS: 1.0000 | ORAL_TABLET | ORAL | Status: DC | PRN

## 2014-09-08 MED ORDER — DOCUSATE SODIUM 100 MG PO CAPS: 100.0000 mg | ORAL_CAPSULE | Freq: Two times a day (BID) | ORAL | Status: DC

## 2014-09-08 NOTE — Lactation Note (Signed)
This note was copied from the chart of Karen Bauer. Lactation Consultation Note Baby had 5% weight loss in 26 hrs. Has had 11 stools and 5 voids. BF well. I feel like weight loss is d/t large out put. Patient Name: Karen Verne SpurrJessica Penaflor JXBJY'NToday's Date: 09/08/2014     Maternal Data    Feeding Feeding Type: Breast Fed Length of feed: 20 min  LATCH Score/Interventions                      Lactation Tools Discussed/Used     Consult Status      Purvis Sidle G 09/08/2014, 1:36 AM

## 2014-09-08 NOTE — Lactation Note (Signed)
This note was copied from the chart of Karen Bauer. Lactation Consultation Note  Discussed off-center latch and gave additional positioning tips to parents.  Explained to mom the need for [name] to soften the breast during feedings.  Also explained that if he did not soften the breast mom need to hand express or pump to soften the breast.  Hand expression reviewed.  Parents feeling more confident about feedings and were able to latch [name] independently to the right breast.  Aware of support group and outpatient services for follow-up.  Patient Name: Karen Bauer Today's Date: 09/08/2014     Maternal Data    Feeding Feeding Type: Breast Fed Length of feed: 20 min  LATCH Score/Interventions Latch: Grasps breast easily, tongue down, lips flanged, rhythmical sucking.  Audible Swallowing: A few with stimulation Intervention(s): Skin to skin;Hand expression  Type of Nipple: Everted at rest and after stimulation (Short shaft)  Comfort (Breast/Nipple): Filling, red/small blisters or bruises, mild/mod discomfort  Problem noted: Cracked, bleeding, blisters, bruises;Mild/Moderate discomfort  Hold (Positioning): Assistance needed to correctly position infant at breast and maintain latch.  LATCH Score: 7  Lactation Tools Discussed/Used     Consult Status Consult Status: PRN Follow-up type: Call as needed    Soyla DryerJoseph, Morad Tal 09/08/2014, 10:05 AM

## 2014-09-08 NOTE — Discharge Summary (Signed)
Obstetric Discharge Summary Reason for Admission: onset of labor Prenatal Procedures: ultrasound Intrapartum Procedures: spontaneous vaginal delivery Postpartum Procedures: none Complications-Operative and Postpartum: none HEMOGLOBIN  Date Value Ref Range Status  09/07/2014 11.5* 12.0 - 15.0 g/dL Final   HCT  Date Value Ref Range Status  09/07/2014 32.9* 36.0 - 46.0 % Final    Physical Exam:  General: alert, cooperative and no distress Lochia: appropriate Uterine Fundus: firm Incision: n/a DVT Evaluation: No evidence of DVT seen on physical exam. Negative Homan's sign. No cords or calf tenderness.  Discharge Diagnoses: Term Pregnancy-delivered  Discharge Information: Date: 09/08/2014 Activity: pelvic rest Diet: routine Medications: PNV, Ibuprofen and Percocet Condition: stable and improved Instructions: refer to practice specific booklet Discharge to: home   Newborn Data: Live born female  Birth Weight: 8 lb 1.4 oz (3668 g) APGAR: 8, 9  Home with mother.  Wyvonnia DuskyLAWSON, Megin Consalvo DARLENE 09/08/2014, 7:39 AM

## 2014-09-09 ENCOUNTER — Encounter: Payer: Self-pay | Admitting: Obstetrics & Gynecology

## 2014-09-11 ENCOUNTER — Encounter: Payer: 59 | Admitting: Family Medicine

## 2014-09-16 ENCOUNTER — Inpatient Hospital Stay (HOSPITAL_COMMUNITY)
Admission: AD | Admit: 2014-09-16 | Discharge: 2014-09-16 | Disposition: A | Discharge status: Home / Self Care | Payer: 59 | Source: Ambulatory Visit | Attending: Family Medicine | Admitting: Family Medicine

## 2014-09-16 ENCOUNTER — Encounter (HOSPITAL_COMMUNITY): Payer: Self-pay | Admitting: *Deleted

## 2014-09-16 DIAGNOSIS — N644 Mastodynia: Secondary | ICD-10-CM | POA: Diagnosis present

## 2014-09-16 DIAGNOSIS — N61 Inflammatory disorders of breast: Secondary | ICD-10-CM | POA: Diagnosis not present

## 2014-09-16 DIAGNOSIS — O9122 Nonpurulent mastitis associated with the puerperium: Secondary | ICD-10-CM | POA: Insufficient documentation

## 2014-09-16 LAB — URINE MICROSCOPIC-ADD ON

## 2014-09-16 LAB — URINALYSIS, ROUTINE W REFLEX MICROSCOPIC
Bilirubin Urine: NEGATIVE
Glucose, UA: NEGATIVE mg/dL
KETONES UR: NEGATIVE mg/dL
Nitrite: NEGATIVE
PROTEIN: NEGATIVE mg/dL
Specific Gravity, Urine: 1.01 (ref 1.005–1.030)
UROBILINOGEN UA: 0.2 mg/dL (ref 0.0–1.0)
pH: 7 (ref 5.0–8.0)

## 2014-09-16 LAB — CBC WITH DIFFERENTIAL/PLATELET
BASOS PCT: 0 % (ref 0–1)
Basophils Absolute: 0 10*3/uL (ref 0.0–0.1)
Eosinophils Absolute: 0.1 10*3/uL (ref 0.0–0.7)
Eosinophils Relative: 1 % (ref 0–5)
HCT: 37.7 % (ref 36.0–46.0)
Hemoglobin: 13.2 g/dL (ref 12.0–15.0)
Lymphocytes Relative: 7 % — ABNORMAL LOW (ref 12–46)
Lymphs Abs: 1.1 10*3/uL (ref 0.7–4.0)
MCH: 30.1 pg (ref 26.0–34.0)
MCHC: 35 g/dL (ref 30.0–36.0)
MCV: 86.1 fL (ref 78.0–100.0)
MONOS PCT: 2 % — AB (ref 3–12)
Monocytes Absolute: 0.3 10*3/uL (ref 0.1–1.0)
NEUTROS ABS: 13 10*3/uL — AB (ref 1.7–7.7)
NEUTROS PCT: 90 % — AB (ref 43–77)
Platelets: 233 10*3/uL (ref 150–400)
RBC: 4.38 MIL/uL (ref 3.87–5.11)
RDW: 12.6 % (ref 11.5–15.5)
WBC: 14.6 10*3/uL — AB (ref 4.0–10.5)

## 2014-09-16 MED ORDER — DICLOXACILLIN SODIUM 500 MG PO CAPS: 500.0000 mg | ORAL_CAPSULE | Freq: Four times a day (QID) | ORAL | Status: DC

## 2014-09-16 MED ORDER — SODIUM CHLORIDE 0.9 % IV SOLN
Freq: Once | INTRAVENOUS | Status: AC
  Administered 2014-09-16: 20:00:00 via INTRAVENOUS

## 2014-09-16 MED ORDER — DICLOXACILLIN SODIUM 500 MG PO CAPS
500.0000 mg | ORAL_CAPSULE | Freq: Once | ORAL | Status: AC
  Administered 2014-09-16: 500 mg via ORAL
  Filled 2014-09-16: qty 1

## 2014-09-16 MED ORDER — ACETAMINOPHEN 325 MG PO TABS
650.0000 mg | ORAL_TABLET | Freq: Once | ORAL | Status: AC
  Administered 2014-09-16: 650 mg via ORAL
  Filled 2014-09-16: qty 2

## 2014-09-16 NOTE — Discharge Instructions (Signed)
Breastfeeding and Mastitis °Mastitis is inflammation of the breast tissue. It can occur in women who are breastfeeding. This can make breastfeeding painful. Mastitis will sometimes go away on its own. Your health care provider will help determine if treatment is needed. °CAUSES °Mastitis is often associated with a blocked milk (lactiferous) duct. This can happen when too much milk builds up in the breast. Causes of excess milk in the breast can include: °· Poor latch-on. If your baby is not latched onto the breast properly, she or he may not empty your breast completely while breastfeeding. °· Allowing too much time to pass between feedings. °· Wearing a bra or other clothing that is too tight. This puts extra pressure on the lactiferous ducts so milk does not flow through them as it should. °Mastitis can also be caused by a bacterial infection. Bacteria may enter the breast tissue through cuts or openings in the skin. In women who are breastfeeding, this may occur because of cracked or irritated skin. Cracks in the skin are often caused when your baby does not latch on properly to the breast. °SIGNS AND SYMPTOMS °· Swelling, redness, tenderness, and pain in an area of the breast. °· Swelling of the glands under the arm on the same side. °· Fever may or may not accompany mastitis. °If an infection is allowed to progress, a collection of pus (abscess) may develop. °DIAGNOSIS  °Your health care provider can usually diagnose mastitis based on your symptoms and a physical exam. Tests may be done to help confirm the diagnosis. These may include: °· Removal of pus from the breast by applying pressure to the area. This pus can be examined in the lab to determine which bacteria are present. If an abscess has developed, the fluid in the abscess can be removed with a needle. This can also be used to confirm the diagnosis and determine the bacteria present. In most cases, pus will not be present. °· Blood tests to determine if  your body is fighting a bacterial infection. °· Mammogram or ultrasound tests to rule out other problems or diseases. °TREATMENT  °Mastitis that occurs with breastfeeding will sometimes go away on its own. Your health care provider may choose to wait 24 hours after first seeing you to decide whether a prescription medicine is needed. If your symptoms are worse after 24 hours, your health care provider will likely prescribe an antibiotic medicine to treat the mastitis. He or she will determine which bacteria are most likely causing the infection and will then select an appropriate antibiotic medicine. This is sometimes changed based on the results of tests performed to identify the bacteria, or if there is no response to the antibiotic medicine selected. Antibiotic medicines are usually given by mouth. You may also be given medicine for pain. °HOME CARE INSTRUCTIONS °· Only take over-the-counter or prescription medicines for pain, fever, or discomfort as directed by your health care provider. °· If your health care provider prescribed an antibiotic medicine, take the medicine as directed. Make sure you finish it even if you start to feel better. °· Do not wear a tight or underwire bra. Wear a soft, supportive bra. °· Increase your fluid intake, especially if you have a fever. °· Continue to empty the breast. Your health care provider can tell you whether this milk is safe for your infant or needs to be thrown out. You may be told to stop nursing until your health care provider thinks it is safe for your baby.   Use a breast pump if you are advised to stop nursing. °· Keep your nipples clean and dry. °· Empty the first breast completely before going to the other breast. If your baby is not emptying your breasts completely for some reason, use a breast pump to empty your breasts. °· If you go back to work, pump your breasts while at work to stay in time with your nursing schedule. °· Avoid allowing your breasts to become  overly filled with milk (engorged). °SEEK MEDICAL CARE IF: °· You have pus-like discharge from the breast. °· Your symptoms do not improve with the treatment prescribed by your health care provider within 2 days. °SEEK IMMEDIATE MEDICAL CARE IF: °· Your pain and swelling are getting worse. °· You have pain that is not controlled with medicine. °· You have a red line extending from the breast toward your armpit. °· You have a fever or persistent symptoms for more than 2-3 days. °· You have a fever and your symptoms suddenly get worse. °MAKE SURE YOU:  °· Understand these instructions. °· Will watch your condition. °· Will get help right away if you are not doing well or get worse. °Document Released: 07/30/2004 Document Revised: 04/09/2013 Document Reviewed: 11/08/2012 °Exit[employer]® Patient Information ©2015 [employer]. This information is not intended to replace advice given to you by your health care provider. Make sure you discuss any questions you have with your health care provider. ° °

## 2014-09-16 NOTE — MAU Note (Signed)
R breast with some redness around areola. Breast somewhat full but not hard.

## 2014-09-16 NOTE — MAU Note (Signed)
Delivered 05/21- vaginal.   Started having chills in the night, about an hour ago checked temp was 101.7.  Nipples feel tingly, has been breast feeding- going well.  Baby is doing great.  Had bright red bleeding today. - had changed to a peachy color, then back to bright red today- passed a small clot. Has not gotten heavier.

## 2014-09-16 NOTE — MAU Provider Note (Signed)
History     CSN: [id number]  Arrival date and time: 09/16/14 1758   None     Chief Complaint  Patient presents with  . Fever  . Breast Pain   HPI  Pt is a 24 y.o. here status post a NSVD on 05/21.  Report having breast pain and chills that started 3 days ago.  Breasts feels "tingly".  Breasts feel engorged.  Breastfeeding well.  Denies dominant breast mass.  Upon review of the chart patient had a uneventful labor and birth.  No noted temp or complication other than removal of vaginal septum.  Bleeding has decreased, however last night noticed bright red blood and small clot.  +pain in suprapubic area yesterday.  None at this time.   Increased activity for past two days.    Past Medical History  Diagnosis Date  . IBS (irritable bowel syndrome)     ?    Past Surgical History  Procedure Laterality Date  . Iud removal      skyla removed 7/14    Family History  Problem Relation Age of Onset  . Depression Mother   . Varicose Veins Maternal Grandmother   . Multiple sclerosis Maternal Grandmother   . Other Maternal Grandmother     liver  . Thyroid disease Maternal Grandmother   . Hyperlipidemia Paternal Grandmother     History  Substance Use Topics  . Smoking status: Never Smoker   . Smokeless tobacco: Never Used  . Alcohol Use: No    Allergies: No Known Allergies  Prescriptions prior to admission  Medication Sig Dispense Refill Last Dose  . Ascorbic Acid (VITAMIN C PO) Take 1 tablet by mouth daily.    09/15/2014 at Unknown time  . docusate sodium (COLACE) 100 MG capsule Take 1 capsule (100 mg total) by mouth 2 (two) times daily. 20 capsule 0 09/15/2014 at Unknown time  . ibuprofen (ADVIL,MOTRIN) 600 MG tablet Take 1 tablet (600 mg total) by mouth every 6 (six) hours. 30 tablet 0 09/15/2014 at Unknown time  . Prenatal Vit-Fe Fumarate-FA (PRENATAL VITAMIN PO) Take 1 tablet by mouth daily.    09/15/2014 at Unknown time  . Witch Hazel (PREPARATION H EX) Apply 1 application  topically daily.   09/15/2014 at Unknown time  . oxyCODONE-acetaminophen (PERCOCET/ROXICET) 5-325 MG per tablet Take 1 tablet by mouth every 4 (four) hours as needed (for pain scale 4-7). (Patient not taking: Reported on 09/16/2014) 30 tablet 0 Not Taking at Unknown time    Review of Systems  Constitutional: Positive for fever and chills.  Gastrointestinal: Positive for abdominal pain. Negative for nausea and vomiting.  Genitourinary: Negative for dysuria, urgency and frequency.       Vaginal bleeding, clot  Musculoskeletal:       Breast pain  Neurological: Positive for headaches.  All other systems reviewed and are negative.  Physical Exam   Blood pressure 132/72, pulse 118, temperature 100.9 F (38.3 C), temperature source Oral, resp. rate 18, last menstrual period 11/26/2013, unknown if currently breastfeeding.  Physical Exam  Constitutional: She is oriented to person, place, and time. She appears well-developed and well-nourished.  HENT:  Head: Normocephalic.  Neck: Normal range of motion. Neck supple.  Cardiovascular: Regular rhythm and normal heart sounds.   Respiratory: Effort normal and breath sounds normal. No respiratory distress. Right breast exhibits tenderness. Right breast exhibits no mass. Left breast exhibits tenderness.  Right breast erythema; no dominant mass; left breast engorged  GI: Soft. There is no tenderness.  Genitourinary: There is bleeding (scant; dark red; no bright red or brisk bleed) in the vagina. Vaginal discharge: vaginal bleeding.  Neurological: She is alert and oriented to person, place, and time. She has normal reflexes.  Skin: Skin is warm and dry.    MAU Course  Procedures  1L NS Tylenol 650 mg PO Dicloxacillin 500 mg po  Assessment and Plan  Mastitis  Plan: Increase fluids RX Dicloxacillin 500 mg QID x 10 days Rest, rest, rest Increase breasfeeding Report no improvement or worsening of symptoms in next 24-48 hours   Walidah Kennith GainN  Karim, CNM

## 2014-09-16 NOTE — MAU Note (Signed)
Some nausea. No problems with elimination.

## 2014-09-17 LAB — URINE CULTURE

## 2014-09-18 ENCOUNTER — Encounter: Payer: Self-pay | Admitting: Obstetrics & Gynecology

## 2014-09-18 ENCOUNTER — Ambulatory Visit (INDEPENDENT_AMBULATORY_CARE_PROVIDER_SITE_OTHER): Payer: 59 | Admitting: Obstetrics & Gynecology

## 2014-09-18 VITALS — BP 118/73 | HR 87 | Temp 98.4°F | Ht 63.0 in | Wt 168.9 lb

## 2014-09-18 DIAGNOSIS — N61 Inflammatory disorders of breast: Secondary | ICD-10-CM | POA: Diagnosis not present

## 2014-09-18 NOTE — Patient Instructions (Signed)
Breastfeeding and Mastitis °Mastitis is inflammation of the breast tissue. It can occur in women who are breastfeeding. This can make breastfeeding painful. Mastitis will sometimes go away on its own. Your health care provider will help determine if treatment is needed. °CAUSES °Mastitis is often associated with a blocked milk (lactiferous) duct. This can happen when too much milk builds up in the breast. Causes of excess milk in the breast can include: °· Poor latch-on. If your baby is not latched onto the breast properly, she or he may not empty your breast completely while breastfeeding. °· Allowing too much time to pass between feedings. °· Wearing a bra or other clothing that is too tight. This puts extra pressure on the lactiferous ducts so milk does not flow through them as it should. °Mastitis can also be caused by a bacterial infection. Bacteria may enter the breast tissue through cuts or openings in the skin. In women who are breastfeeding, this may occur because of cracked or irritated skin. Cracks in the skin are often caused when your baby does not latch on properly to the breast. °SIGNS AND SYMPTOMS °· Swelling, redness, tenderness, and pain in an area of the breast. °· Swelling of the glands under the arm on the same side. °· Fever may or may not accompany mastitis. °If an infection is allowed to progress, a collection of pus (abscess) may develop. °DIAGNOSIS  °Your health care provider can usually diagnose mastitis based on your symptoms and a physical exam. Tests may be done to help confirm the diagnosis. These may include: °· Removal of pus from the breast by applying pressure to the area. This pus can be examined in the lab to determine which bacteria are present. If an abscess has developed, the fluid in the abscess can be removed with a needle. This can also be used to confirm the diagnosis and determine the bacteria present. In most cases, pus will not be present. °· Blood tests to determine if  your body is fighting a bacterial infection. °· Mammogram or ultrasound tests to rule out other problems or diseases. °TREATMENT  °Mastitis that occurs with breastfeeding will sometimes go away on its own. Your health care provider may choose to wait 24 hours after first seeing you to decide whether a prescription medicine is needed. If your symptoms are worse after 24 hours, your health care provider will likely prescribe an antibiotic medicine to treat the mastitis. He or she will determine which bacteria are most likely causing the infection and will then select an appropriate antibiotic medicine. This is sometimes changed based on the results of tests performed to identify the bacteria, or if there is no response to the antibiotic medicine selected. Antibiotic medicines are usually given by mouth. You may also be given medicine for pain. °HOME CARE INSTRUCTIONS °· Only take over-the-counter or prescription medicines for pain, fever, or discomfort as directed by your health care provider. °· If your health care provider prescribed an antibiotic medicine, take the medicine as directed. Make sure you finish it even if you start to feel better. °· Do not wear a tight or underwire bra. Wear a soft, supportive bra. °· Increase your fluid intake, especially if you have a fever. °· Continue to empty the breast. Your health care provider can tell you whether this milk is safe for your infant or needs to be thrown out. You may be told to stop nursing until your health care provider thinks it is safe for your baby.   Use a breast pump if you are advised to stop nursing. °· Keep your nipples clean and dry. °· Empty the first breast completely before going to the other breast. If your baby is not emptying your breasts completely for some reason, use a breast pump to empty your breasts. °· If you go back to work, pump your breasts while at work to stay in time with your nursing schedule. °· Avoid allowing your breasts to become  overly filled with milk (engorged). °SEEK MEDICAL CARE IF: °· You have pus-like discharge from the breast. °· Your symptoms do not improve with the treatment prescribed by your health care provider within 2 days. °SEEK IMMEDIATE MEDICAL CARE IF: °· Your pain and swelling are getting worse. °· You have pain that is not controlled with medicine. °· You have a red line extending from the breast toward your armpit. °· You have a fever or persistent symptoms for more than 2-3 days. °· You have a fever and your symptoms suddenly get worse. °MAKE SURE YOU:  °· Understand these instructions. °· Will watch your condition. °· Will get help right away if you are not doing well or get worse. °Document Released: 07/30/2004 Document Revised: 04/09/2013 Document Reviewed: 11/08/2012 °[employer]® Patient Information ©2015 [employer]. This information is not intended to replace advice given to you by your health care provider. Make sure you discuss any questions you have with your health care provider. ° °

## 2014-09-18 NOTE — Progress Notes (Signed)
Subjective:     Patient ID: Karen SpurrJessica Bauer, female   DOB: 02/08/1991, 24 y.o.   MRN: [id number]  HPI G1P1001 S/P NSVD 5/21/ 16 now nursing, with few days of right>left breast pain and fever, dx with mastitis in MAU 5/31. Started ABX yesterday, had fever yesterday. Baby doing well No Known Allergies Current Outpatient Prescriptions on File Prior to Visit  Medication Sig Dispense Refill  . Ascorbic Acid (VITAMIN C PO) Take 1 tablet by mouth daily.     . dicloxacillin (DYNAPEN) 500 MG capsule Take 1 capsule (500 mg total) by mouth 4 (four) times daily. 39 capsule 0  . docusate sodium (COLACE) 100 MG capsule Take 1 capsule (100 mg total) by mouth 2 (two) times daily. 20 capsule 0  . ibuprofen (ADVIL,MOTRIN) 600 MG tablet Take 1 tablet (600 mg total) by mouth every 6 (six) hours. 30 tablet 0  . Prenatal Vit-Fe Fumarate-FA (PRENATAL VITAMIN PO) Take 1 tablet by mouth daily.     Weyman Croon. Witch Hazel (PREPARATION H EX) Apply 1 application topically daily.    Marland Kitchen. oxyCODONE-acetaminophen (PERCOCET/ROXICET) 5-325 MG per tablet Take 1 tablet by mouth every 4 (four) hours as needed (for pain scale 4-7). (Patient not taking: Reported on 09/16/2014) 30 tablet 0   No current facility-administered medications on file prior to visit.     Review of Systems  Constitutional: Positive for fever.  Respiratory: Negative.   Gastrointestinal: Negative.   Genitourinary: Negative.   Breast red on right     Objective:   Physical Exam  Constitutional: She is oriented to person, place, and time. She appears well-developed. No distress.  Pulmonary/Chest: Effort normal. No respiratory distress.  Erythema right breast no mass minimal tenderness, c/w lactation bilaterally  Neurological: She is alert and oriented to person, place, and time.  Skin: Skin is warm and dry.  Psychiatric: She has a normal mood and affect. Her behavior is normal.       Assessment:     Acute mastitis assoc with lactation on proper abx therapy      Plan:     Continue Dicloxacillin as rx. Instructions on when to report to clinic were given, RTC 4 weeks.   10 min > 50% face to face and coordination of care  Adam PhenixJames G Sahasra Belue, MD 09/18/2014

## 2014-10-16 ENCOUNTER — Ambulatory Visit (INDEPENDENT_AMBULATORY_CARE_PROVIDER_SITE_OTHER): Payer: 59 | Admitting: Obstetrics & Gynecology

## 2014-10-16 ENCOUNTER — Encounter: Payer: Self-pay | Admitting: Obstetrics & Gynecology

## 2014-10-16 NOTE — Progress Notes (Signed)
Patient ID: Karen Bauer, female   DOB: 04/27/1990, 24 y.o.   MRN: [id number] Subjective:G1P1001 Had small vaginal repair     Karen SpurrJessica Haak is a 24 y.o. female who presents for a postpartum visit. She is 5 weeks postpartum following a spontaneous vaginal delivery. I have fully reviewed the prenatal and intrapartum course. The delivery was at 3468w1d gestational weeks. Outcome: spontaneous vaginal delivery. Anesthesia: epidural. Postpartum course has been good except treated for mastitis. Baby's course has been normal. Baby is feeding by breast. Bleeding pink. Bowel function is normal. Bladder function is normal. Patient is not sexually active. Contraception method is none. Postpartum depression screening: negative.  The following portions of the patient's history were reviewed and updated as appropriate: allergies, current medications, past family history, past medical history, past social history, past surgical history and problem list.  Review of Systems Pertinent items are noted in HPI.   Objective:    There were no vitals taken for this visit.  General:  alert, cooperative and no distress   Breasts:               Vulva:  normal  Vagina: normal vagina  Cervix:     Corpus: not examined  Adnexa:  not evaluated  Rectal Exam: Not performed.        Assessment:     normal postpartum exam. Pap smear not done at today's visit.   Plan:    1. Contraception: rhythm method 2. Nl pap 04/2014 3. Follow up as needed.    Adam PhenixJames G Arnold, MD 10/16/2014

## 2015-02-18 ENCOUNTER — Encounter: Payer: Self-pay | Admitting: *Deleted

## 2016-05-22 IMAGING — US US OB LIMITED
1 series · 13 of 25 positions shown · non-contrast
Comparison: none

[Series 1: us ob limited · 0.21mm/px · 25 acquisitions, 13 frames shown]
[im 1/25]
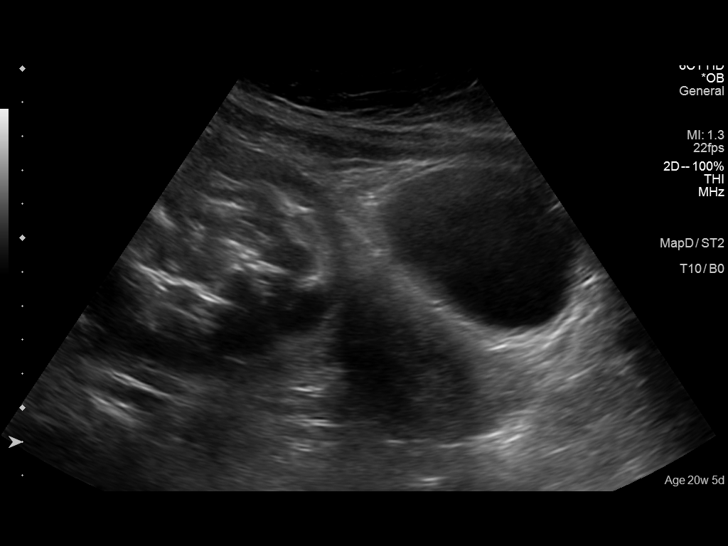
[im 3/25]
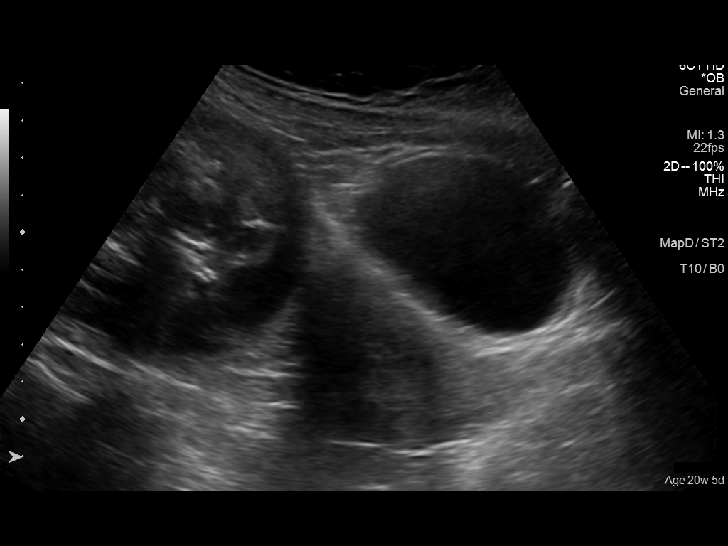
[im 5/25]
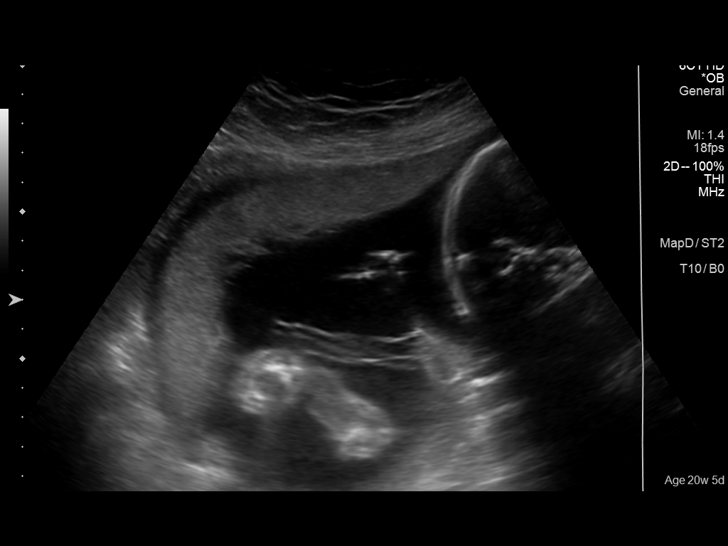
[im 7/25]
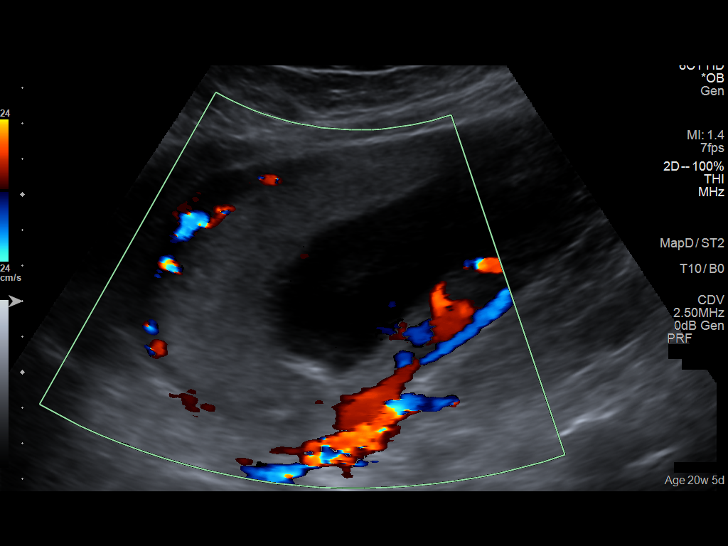
[im 9/25]
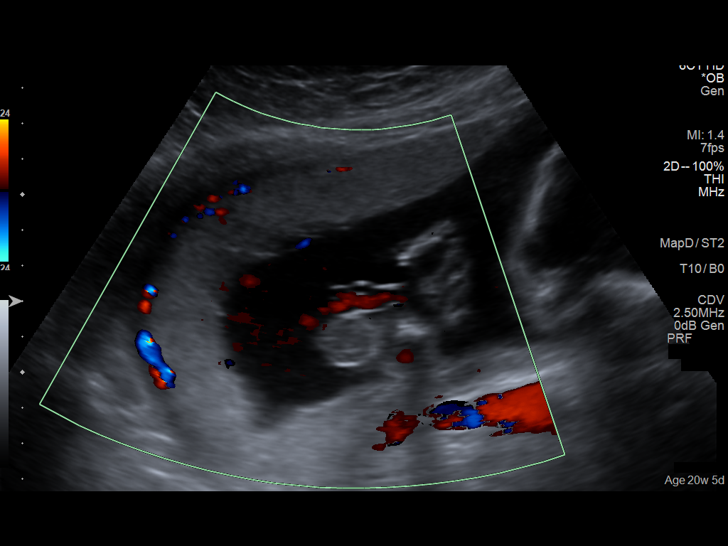
[im 11/25]
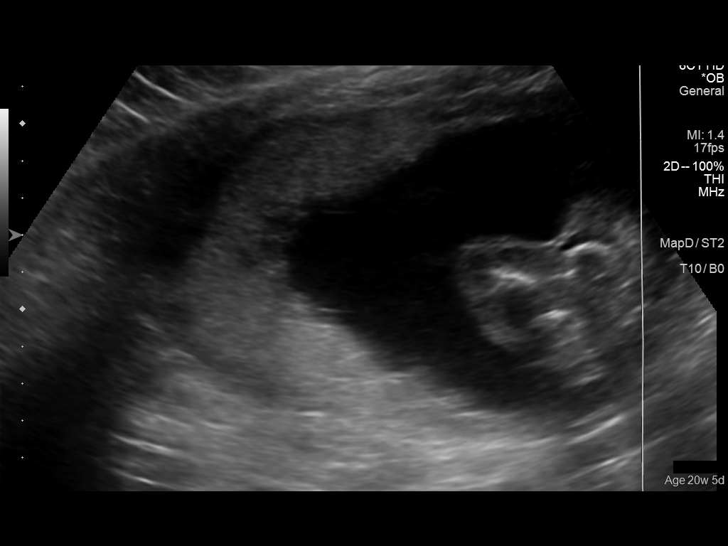
[im 13/25]
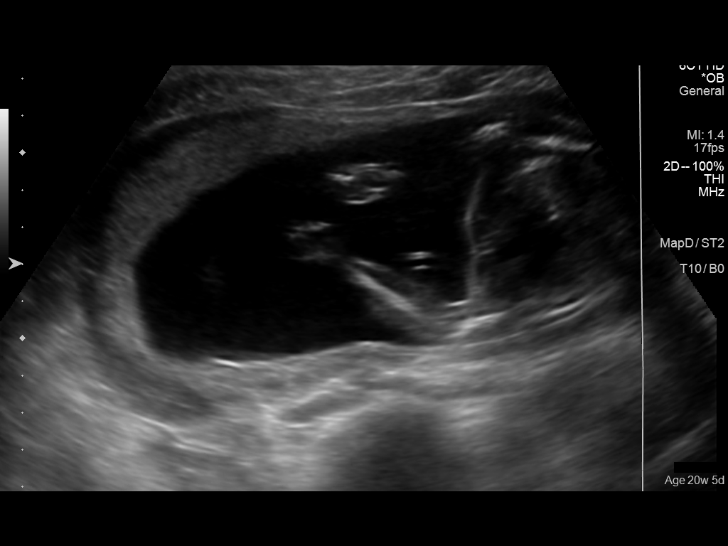
[im 15/25]
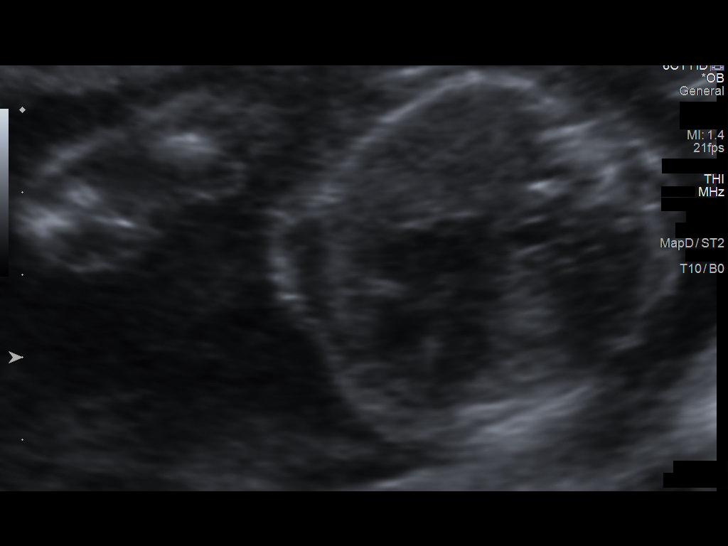
[im 17/25]
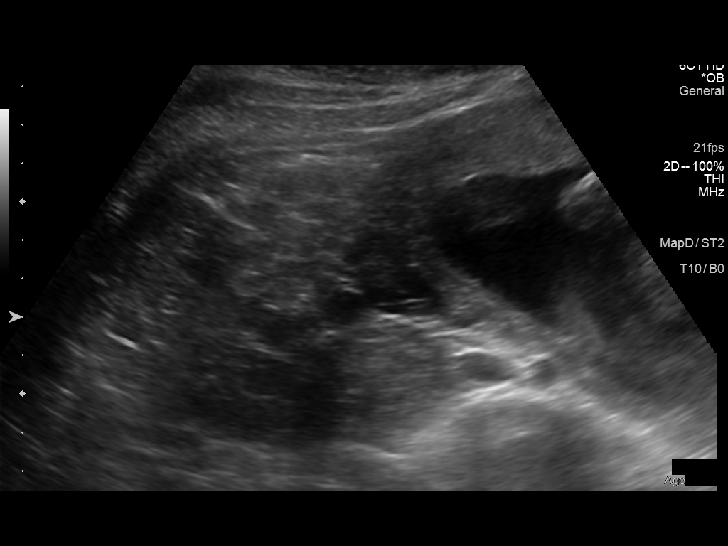
[im 19/25]
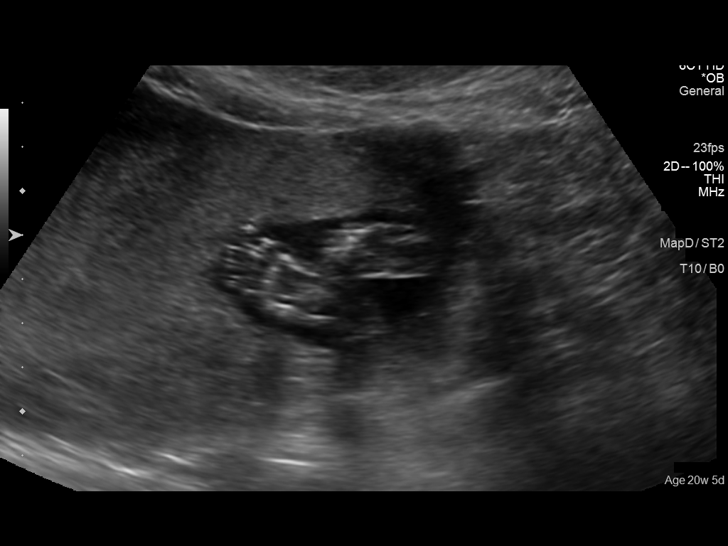
[im 21/25]
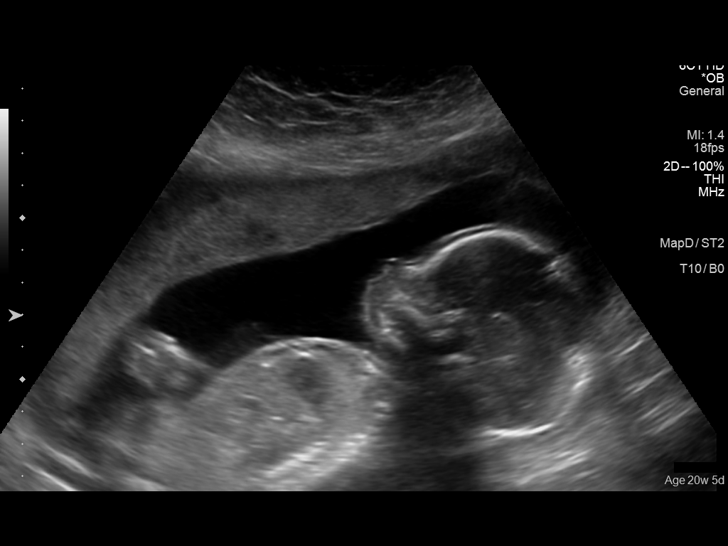
[im 23/25]
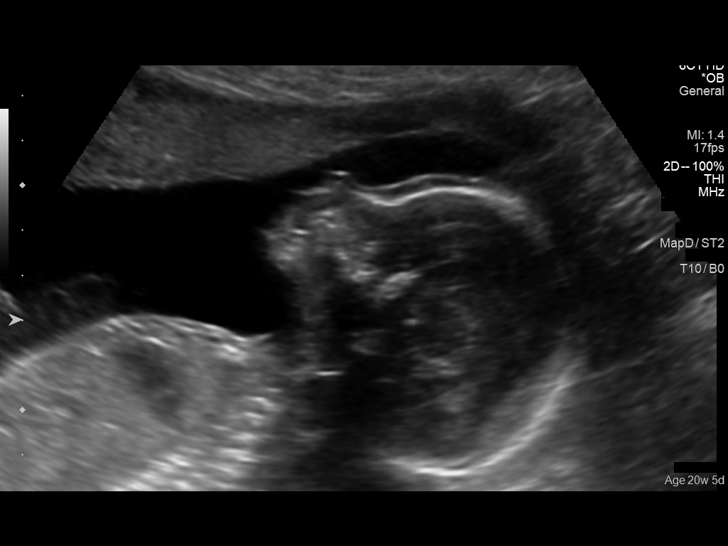
[im 25/25]
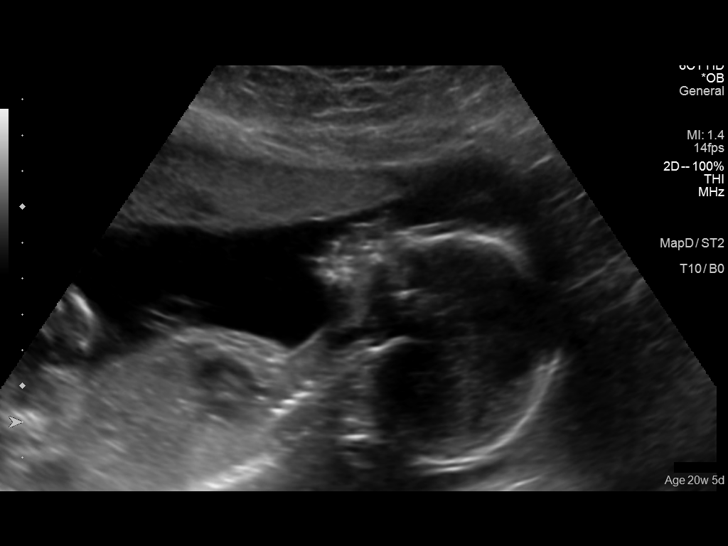

[13 of 25 positions shown; findings below may reference images not displayed]

OBSTETRICS REPORT
                      (Signed Final 04/30/2014 [DATE])

Service(s) Provided

 [HOSPITAL]                                         76815.0
Indications

 Vaginal bleeding, unknown etiology
 20 weeks gestation of pregnancy
Fetal Evaluation

 Num Of Fetuses:    1
 Fetal Heart Rate:  152                          bpm
 Cardiac Activity:  Observed
 Presentation:      Cephalic
 Placenta:          Anterior Fundal, above
                    cervical os

 Amniotic Fluid
 AFI FV:      Subjectively within normal limits
                                             Larg Pckt:     5.6  cm
Gestational Age

 Best:          20w 5d     Det. By:  Early Ultrasound         EDD:   09/12/14
                                     (01/24/14)
Cervix Uterus Adnexa

 Cervical Length:    4.2      cm

 Cervix:       Normal appearance by transabdominal scan.
 Left Ovary:    Not visualized.
 Right Ovary:   Not visualized.
 Adnexa:     No adnexal mass visualized.
Impression

 Single IUP at 20w 5d
 Limited ultrasound performed due to vaginal bleeding
 Anterior/ fundal placenta without previa
 No subchorionic fluid collections noted
 Normal amniotic fluid volume
Recommendations

 Recommend ultrasound for fetal anatomy is not previously
 preformed.
 Otherwise, follow up ultrasounds as clinically indicated

 Thank you for sharing in the care of Ms. RADJIN Xahelo Said with
 questions or concerns.

## 2016-05-24 IMAGING — US US OB COMP +14 WK
1 of 2 series · 12 of 28 positions shown · non-contrast
Comparison: none

[Series 1: us ob comp +14 wk mfm · 104 acquisitions, 12 frames shown]
[im 1/104]
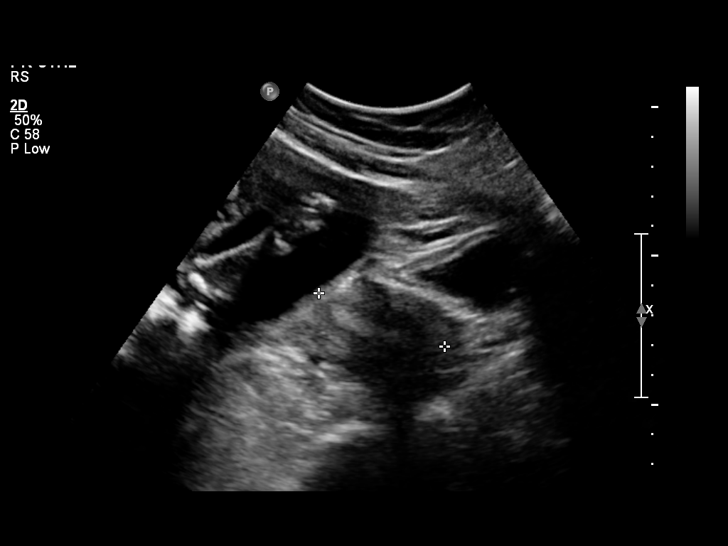
[im 8/104]
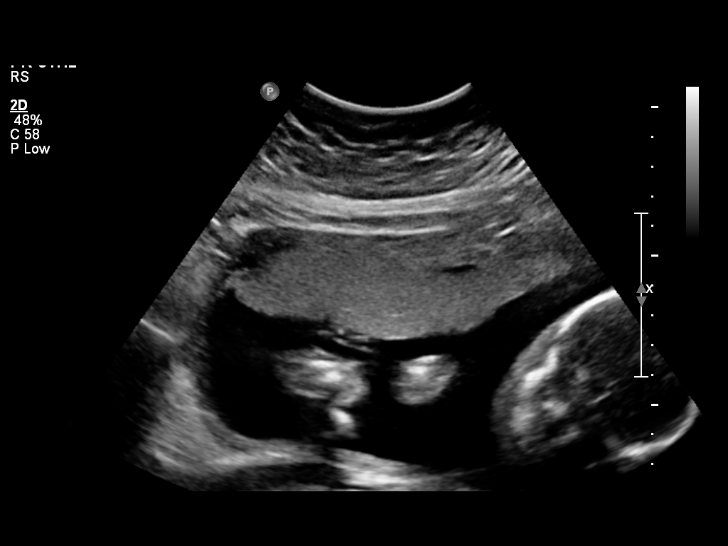
[im 16/104]
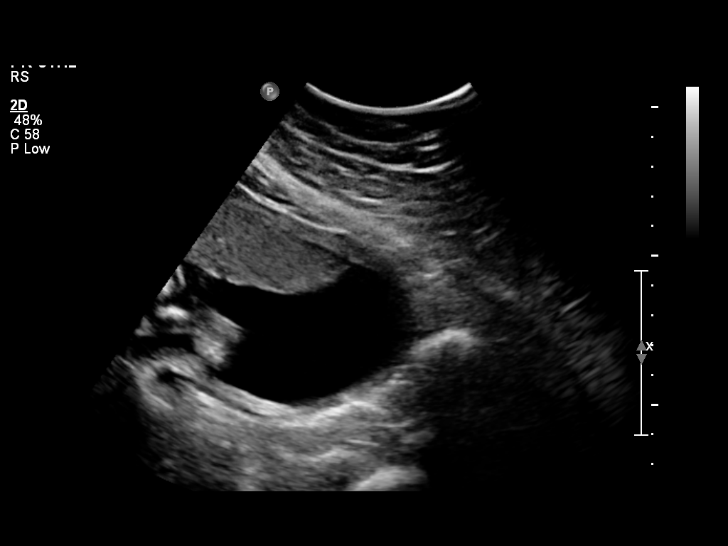
[im 28/104]
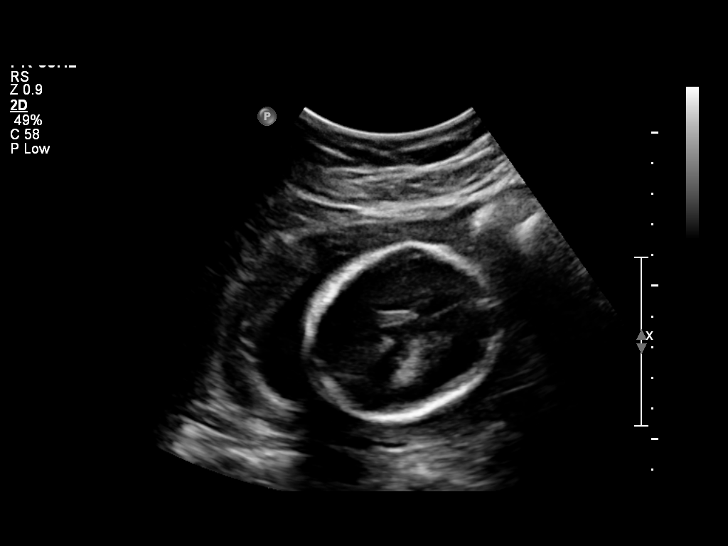
[im 36/104]
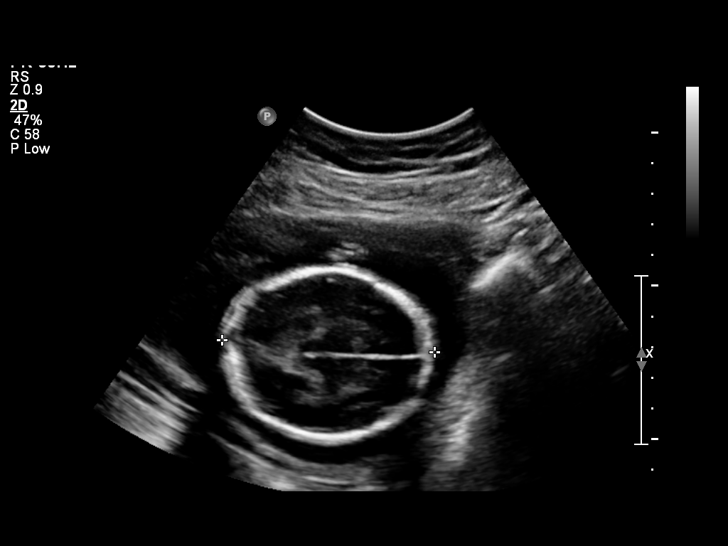
[im 44/104]
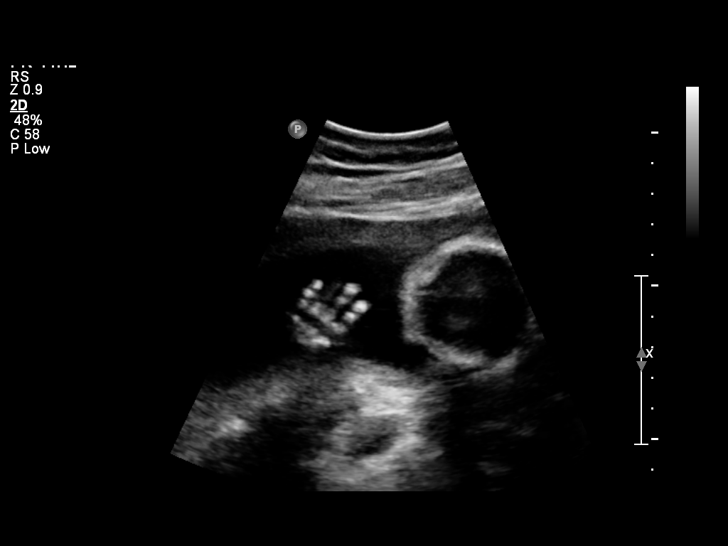
[im 56/104]
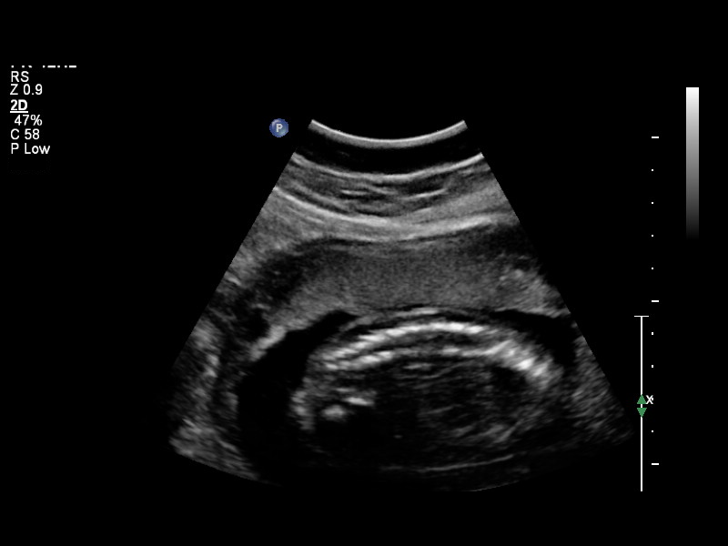
[im 64/104]
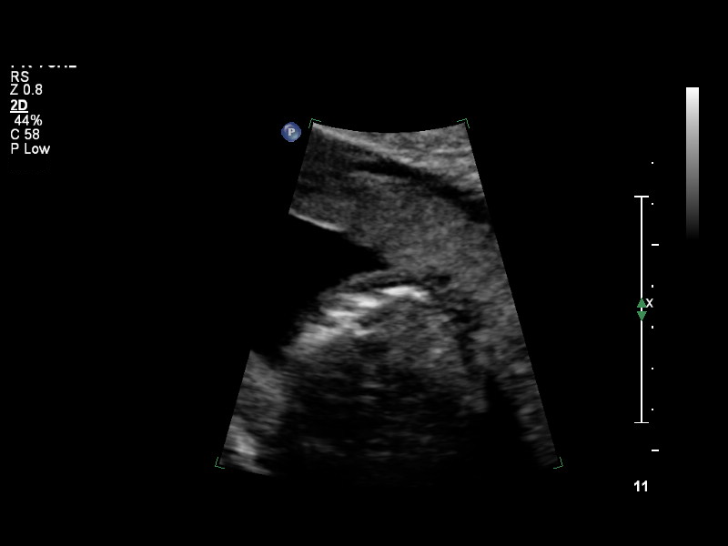
[im 72/104]
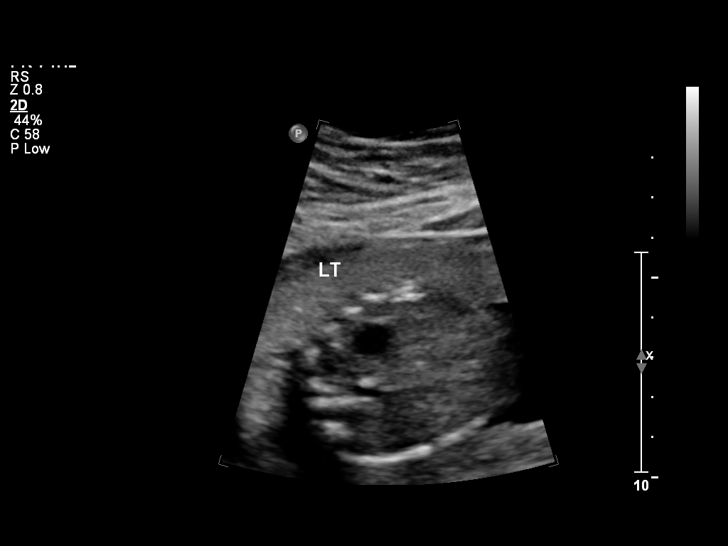
[im 84/104]
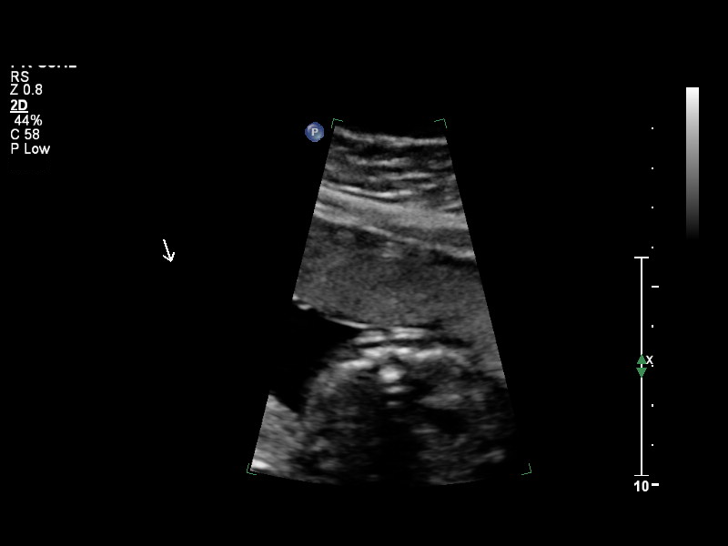
[im 92/104]
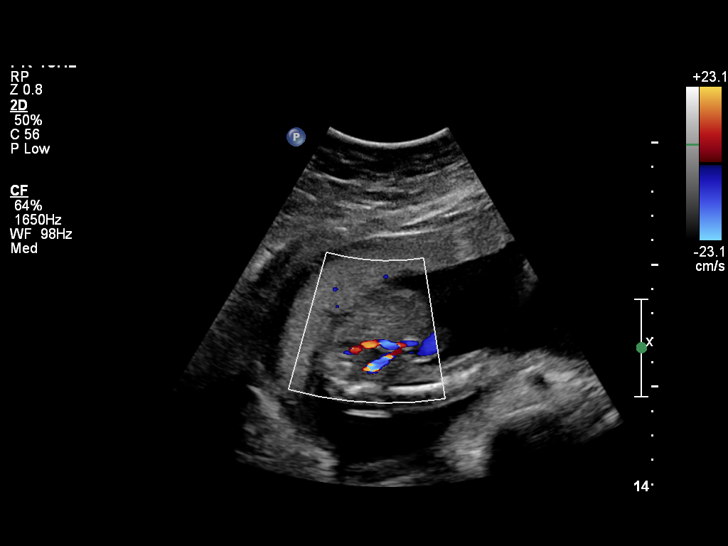
[im 100/104]
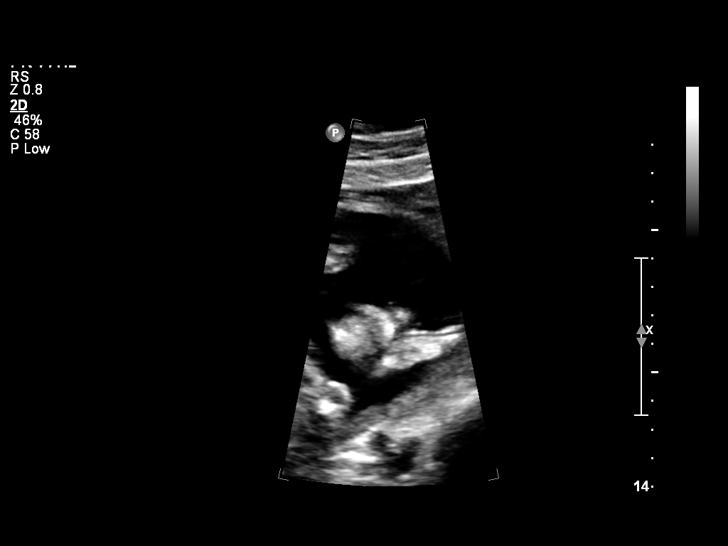

[12 of 28 positions shown; findings below may reference images not displayed]

OBSTETRICS REPORT
                      (Signed Final 05/02/2014 [DATE])

Service(s) Provided

 US OB COMP + 14 WK                                    76805.1
Indications

 21 weeks gestation of pregnancy
 Basic anatomic survey                                 z36
Fetal Evaluation

 Num Of Fetuses:    1
 Fetal Heart Rate:  155                          bpm
 Cardiac Activity:  Observed
 Presentation:      Cephalic
 Placenta:          Anterior, above cervical os
 P. Cord            Visualized, central
 Insertion:

 Membrane Desc:

 Amniotic Fluid
 AFI FV:      Subjectively within normal limits
                                             Larg Pckt:     5.3  cm
Biometry

 BPD:     54.3  mm     G. Age:  22w 4d                CI:         78.6   70 - 86
 OFD:     69.1  mm                                    FL/HC:      19.2   15.9 -

 HC:     200.2  mm     G. Age:  22w 2d       86  %    HC/AC:      1.16   1.06 -

 AC:     172.5  mm     G. Age:  22w 1d       79  %    FL/BPD:
 FL:      38.5  mm     G. Age:  22w 2d       83  %    FL/AC:      22.3   20 - 24
 HUM:     37.4  mm     G. Age:  23w 1d     > 95  %
 CER:     21.7  mm     G. Age:  20w 5d       43  %

 Est. FW:     490  gm      1 lb 1 oz     63  %
Gestational Age

 U/S Today:     22w 2d                                        EDD:   09/03/14
 Best:          21w 0d     Det. By:  D.O. Conception          EDD:   09/12/14
Anatomy
 Cranium:          Appears normal         Aortic Arch:      Appears normal
 Fetal Cavum:      Appears normal         Ductal Arch:      Not well visualized
 Ventricles:       Appears normal         Diaphragm:        Appears normal
 Choroid Plexus:   Appears normal         Stomach:          Appears normal, left
                                                            sided
 Cerebellum:       Appears normal         Abdomen:          Appears normal
 Posterior Fossa:  Appears normal         Abdominal Wall:   Appears nml (cord
                                                            insert, abd wall)
 Nuchal Fold:      Not applicable (>20    Cord Vessels:     Appears normal (3
                   wks GA)                                  vessel cord)
 Face:             Appears normal         Kidneys:          Appear normal
                   (orbits and profile)
 Lips:             Appears normal         Bladder:          Appears normal
 Heart:            Appears normal         Spine:            Appears normal
                   (4CH, axis, and
                   situs)
 RVOT:             Not well visualized    Lower             Appears normal
                                          Extremities:
 LVOT:             Not well visualized    Upper             Appears normal
                                          Extremities:
Cervix Uterus Adnexa

 Cervical Length:    3.94     cm

 Cervix:       Normal appearance by transabdominal scan.
 Uterus:       No abnormality visualized.
 Left Ovary:    Within normal limits.
 Right Ovary:   Within normal limits.

 Adnexa:     No abnormality visualized.
Comments

 The patient reports having an early ultrasound performed in
 [place] - results unavailable.  She has an uncertain LMP.
 Dates at this time are based on date of conception.  There is
 a 9 day dating descrepancy based on ultrasound today and
 date of conception.  Would recommend a follow up
 ultrasound in 4 weeks for growth.  If this discrepancy is again
 noted, would consider adjusting dates based on today's study.
Impression

 Single IUP at 21w 0d (by date of conception)
 Limited views of the fetal heart were obtained
 The remainder of the fetal anatomy is within normal limits
 No markers associated with aneuploidy noted
 Anterior placenta without previa
 Normal amniotic fluid volume
Recommendations

 Recommend follow-up ultrasound examination in 4 weeks for
 interval growth and to reevaluate the fetal heart.

 Thank you for sharing in the care of Ms. NAYAN Auntyjatty with
 questions or concerns.

## 2016-06-25 IMAGING — US US OB FOLLOW-UP
1 series · 12 of 28 positions shown · non-contrast
Comparison: none

[Series 1: us ob follow up · 67 acquisitions, 12 frames shown]
[im 3/67]
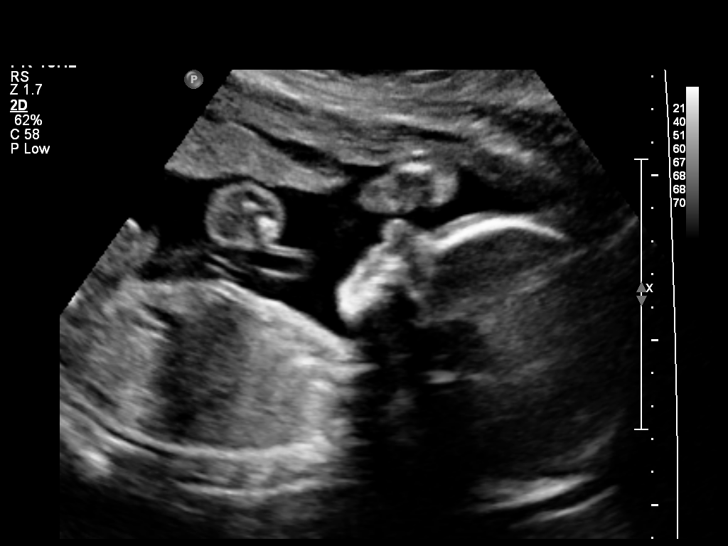
[im 8/67]
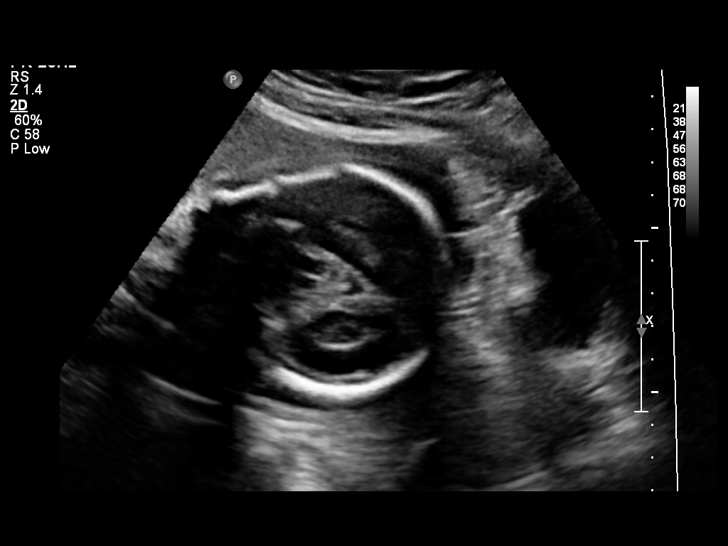
[im 13/67]
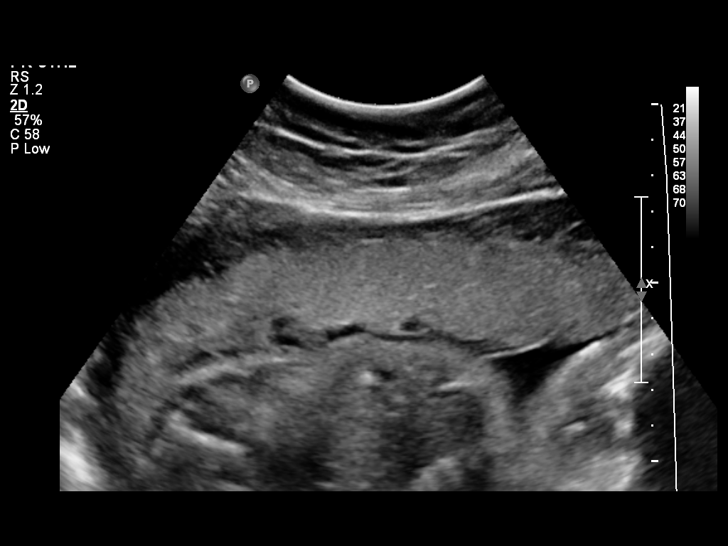
[im 20/67]
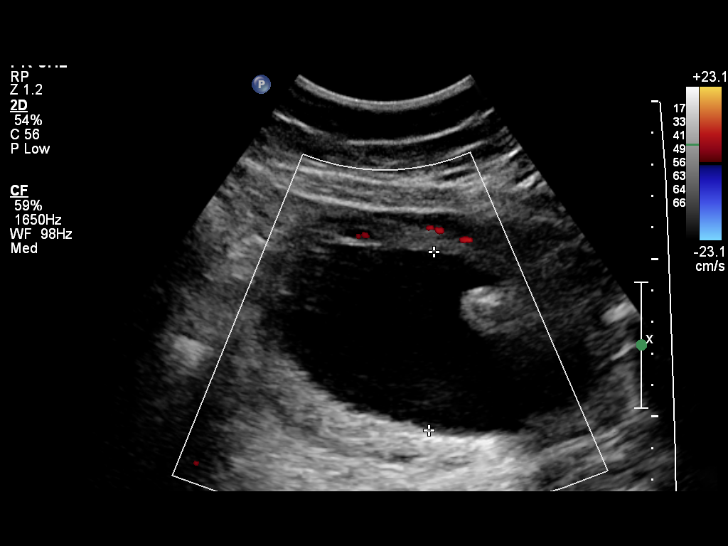
[im 25/67]
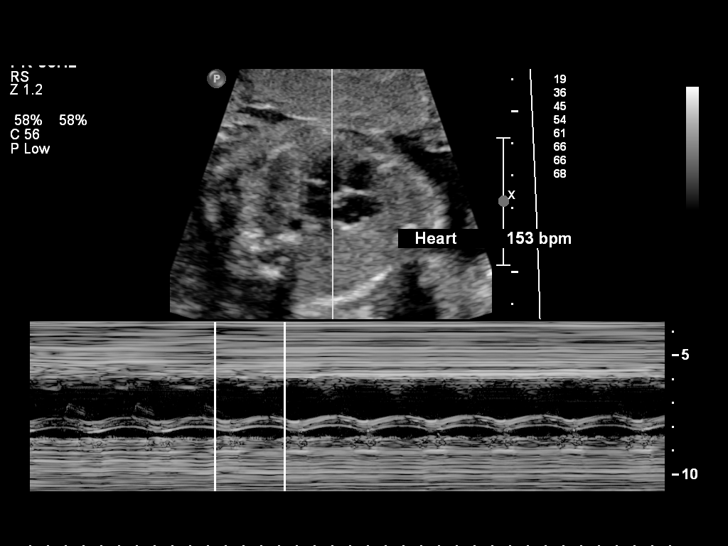
[im 30/67]
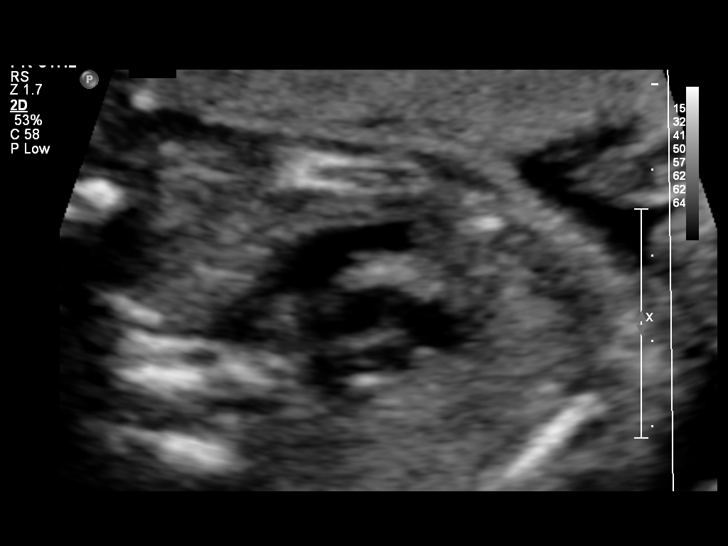
[im 37/67]
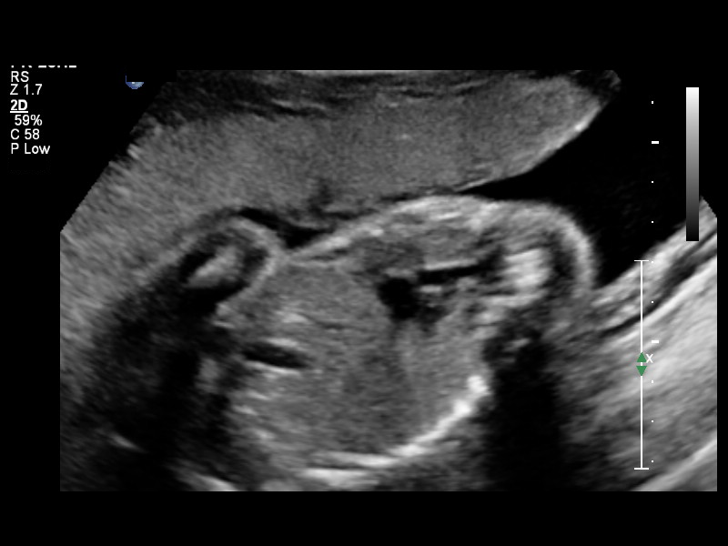
[im 42/67]
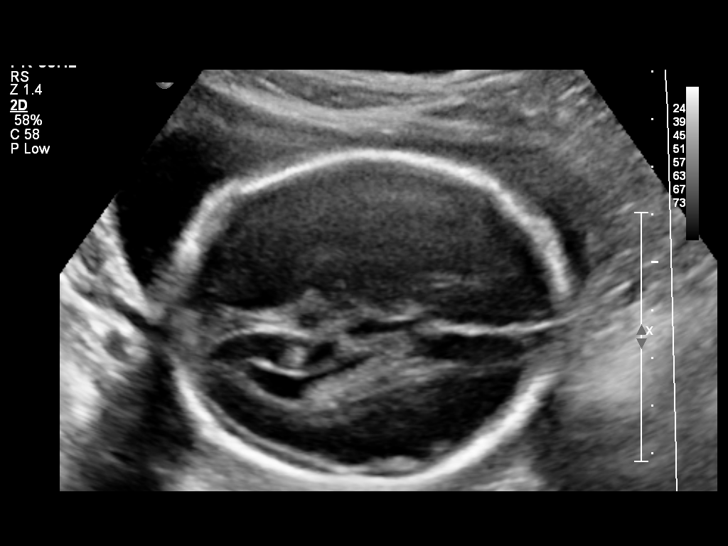
[im 47/67]
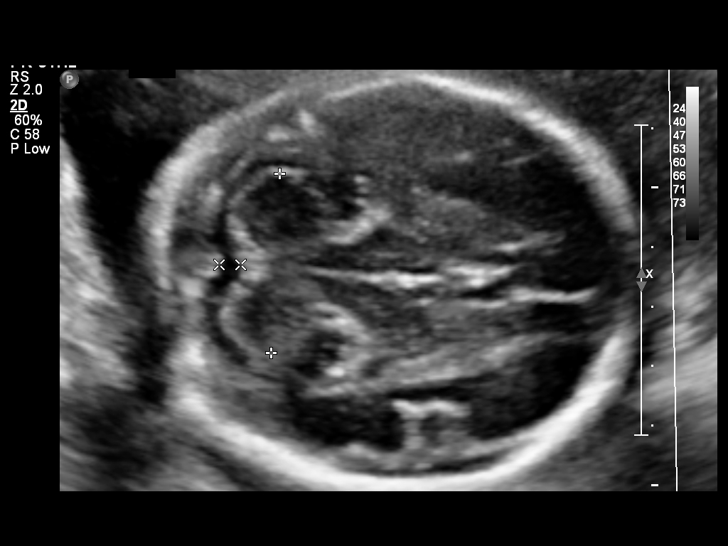
[im 54/67]
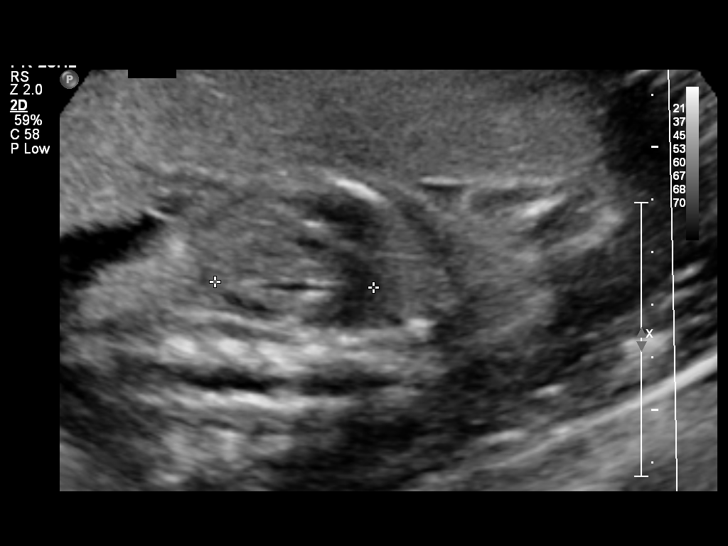
[im 59/67]
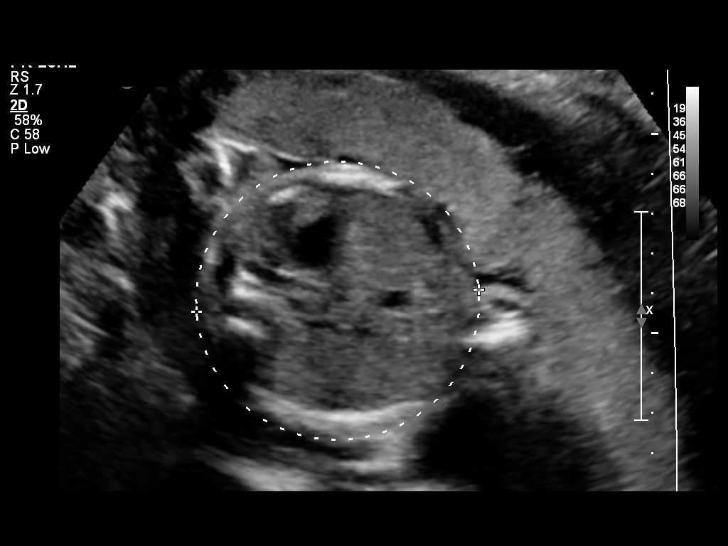
[im 64/67]
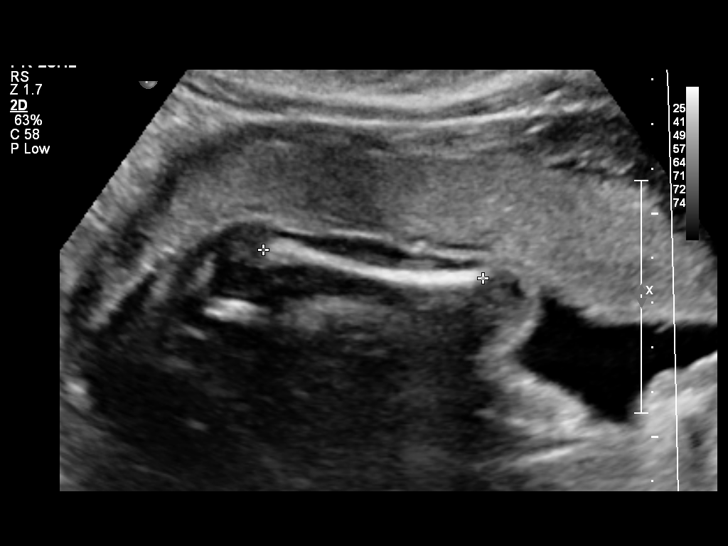

[12 of 28 positions shown; findings below may reference images not displayed]

OBSTETRICS REPORT
                      (Signed Final 06/03/2014 [DATE])

Service(s) Provided

 US OB FOLLOW UP                                       76816.1
Indications

 Follow-up incomplete fetal anatomic evaluation        Z36
 25 weeks gestation of pregnancy
 Uterine size-date discrepancy, second trimester
Fetal Evaluation

 Num Of Fetuses:    1
 Fetal Heart Rate:  153                          bpm
 Cardiac Activity:  Observed
 Presentation:      Cephalic
 Placenta:          Anterior, above cervical os
 P. Cord            Previously Visualized
 Insertion:

 Amniotic Fluid
 AFI FV:      Subjectively within normal limits
                                             Larg Pckt:     5.7  cm
Biometry

 BPD:     68.2  mm     G. Age:  27w 3d                CI:        75.13   70 - 86
                                                      FL/HC:      19.8   18.6 -

 HC:     249.6  mm     G. Age:  27w 0d       78  %    HC/AC:      1.12   1.04 -

 AC:       222  mm     G. Age:  26w 4d       74  %    FL/BPD:     72.4   71 - 87
 FL:      49.4  mm     G. Age:  26w 5d       69  %    FL/AC:      22.3   20 - 24
 HUM:     44.7  mm     G. Age:  26w 4d       68  %
 CER:       30  mm     G. Age:  26w 4d       68  %

 Est. FW:     975  gm      2 lb 2 oz     76  %
Gestational Age

 U/S Today:     26w 6d                                        EDD:   09/03/14
 Best:          25w 4d     Det. By:  Early Ultrasound         EDD:   09/12/14
                                     (01/24/14)
Anatomy
 Cranium:          Appears normal         Aortic Arch:      Previously seen
 Fetal Cavum:      Appears normal         Ductal Arch:      Appears normal
 Ventricles:       Appears normal         Diaphragm:        Appears normal
 Choroid Plexus:   Appears normal         Stomach:          Appears normal, left
                                                            sided
 Cerebellum:       Appears normal         Abdomen:          Appears normal
 Posterior Fossa:  Appears normal         Abdominal Wall:   Previously seen
 Nuchal Fold:      Not applicable (>20    Cord Vessels:     Previously seen
                   wks GA)
 Face:             Orbits and profile     Kidneys:          Appear normal
                   previously seen
 Lips:             Previously seen        Bladder:          Appears normal
 Heart:            Appears normal         Spine:            Previously seen
                   (4CH, axis, and
                   situs)
 RVOT:             Appears normal         Lower             Previously seen
                                          Extremities:
 LVOT:             Appears normal         Upper             Previously seen
                                          Extremities:

 Other:  Fetus appears to be a male. Nasal bone visualized. Heels and 5th
         digit previously seen.
Targeted Anatomy

 Fetal Central Nervous System
 Lat. Ventricles:  5.3                    Cisterna Magna:
Cervix Uterus Adnexa

 Cervical Length:    4.13     cm

 Cervix:       Normal appearance by transabdominal scan.
 Uterus:       No abnormality visualized.
 Left Ovary:    Within normal limits.
 Right Ovary:   Within normal limits.

 Adnexa:     No adnexal mass visualized.
Impression

 SIUP at 25+4  weeks
 Normal interval anatomy; anatomic survey complete
 Normal amniotic fluid volume
 Appropriate interval growth with EFW at the 76th %tile
Recommendations

 Follow-up as clinically indicated

 Thank you for sharing in the care of Ms. SENTENO Likourgos with
 questions or concerns.
